# Patient Record
Sex: Female | Born: 1965 | Race: Black or African American | Hispanic: No | Marital: Single | Smoking: Former smoker
Health system: Southern US, Community
[De-identification: ages and names within clinical notes are randomized; demographics above are authoritative.]

## PROBLEM LIST (undated history)

## (undated) DIAGNOSIS — J45909 Unspecified asthma, uncomplicated: Secondary | ICD-10-CM

## (undated) DIAGNOSIS — I509 Heart failure, unspecified: Secondary | ICD-10-CM

## (undated) DIAGNOSIS — I10 Essential (primary) hypertension: Secondary | ICD-10-CM

## (undated) DIAGNOSIS — I2510 Atherosclerotic heart disease of native coronary artery without angina pectoris: Secondary | ICD-10-CM

## (undated) DIAGNOSIS — J40 Bronchitis, not specified as acute or chronic: Secondary | ICD-10-CM

## (undated) HISTORY — PX: HERNIA REPAIR: SHX51

## (undated) HISTORY — PX: TUBAL LIGATION: SHX77

## (undated) HISTORY — PX: ABDOMINAL HYSTERECTOMY: SHX81

---

## 2009-11-26 ENCOUNTER — Emergency Department (HOSPITAL_COMMUNITY): Admission: EM | Admit: 2009-11-26 | Discharge: 2009-11-26 | Payer: Self-pay | Admitting: Emergency Medicine

## 2009-12-12 ENCOUNTER — Emergency Department (HOSPITAL_COMMUNITY): Admission: EM | Admit: 2009-12-12 | Discharge: 2009-12-12 | Payer: Self-pay | Admitting: Emergency Medicine

## 2009-12-27 ENCOUNTER — Encounter
Admission: RE | Admit: 2009-12-27 | Discharge: 2010-01-19 | Payer: Self-pay | Source: Home / Self Care | Attending: Family Medicine | Admitting: Family Medicine

## 2010-04-26 LAB — URINALYSIS, ROUTINE W REFLEX MICROSCOPIC
Bilirubin Urine: NEGATIVE
Ketones, ur: NEGATIVE mg/dL
Leukocytes, UA: NEGATIVE
Nitrite: NEGATIVE
Protein, ur: NEGATIVE mg/dL
Urobilinogen, UA: 0.2 mg/dL (ref 0.0–1.0)

## 2010-04-26 LAB — URINE MICROSCOPIC-ADD ON

## 2010-04-26 LAB — URINE CULTURE
Colony Count: >=100000
Culture  Setup Time: 201110312247

## 2010-04-26 LAB — WET PREP, GENITAL
WBC, Wet Prep HPF POC: NONE SEEN
Yeast Wet Prep HPF POC: NONE SEEN

## 2010-11-19 ENCOUNTER — Emergency Department (HOSPITAL_BASED_OUTPATIENT_CLINIC_OR_DEPARTMENT_OTHER)
Admission: EM | Admit: 2010-11-19 | Discharge: 2010-11-19 | Disposition: A | Discharge status: Home / Self Care | Payer: Medicaid Other | Attending: Emergency Medicine | Admitting: Emergency Medicine

## 2010-11-19 ENCOUNTER — Encounter: Payer: Self-pay | Admitting: Emergency Medicine

## 2010-11-19 DIAGNOSIS — N390 Urinary tract infection, site not specified: Secondary | ICD-10-CM | POA: Insufficient documentation

## 2010-11-19 DIAGNOSIS — N9489 Other specified conditions associated with female genital organs and menstrual cycle: Secondary | ICD-10-CM | POA: Insufficient documentation

## 2010-11-19 DIAGNOSIS — R300 Dysuria: Secondary | ICD-10-CM | POA: Insufficient documentation

## 2010-11-19 DIAGNOSIS — F17200 Nicotine dependence, unspecified, uncomplicated: Secondary | ICD-10-CM | POA: Insufficient documentation

## 2010-11-19 LAB — URINE MICROSCOPIC-ADD ON

## 2010-11-19 LAB — URINALYSIS, ROUTINE W REFLEX MICROSCOPIC
Glucose, UA: NEGATIVE mg/dL
Protein, ur: NEGATIVE mg/dL
Specific Gravity, Urine: 1.019 (ref 1.005–1.030)

## 2010-11-19 MED ORDER — CIPROFLOXACIN HCL 500 MG PO TABS: 500.0000 mg | ORAL_TABLET | Freq: Two times a day (BID) | ORAL | Status: AC

## 2010-11-19 MED ORDER — OXYCODONE-ACETAMINOPHEN 5-325 MG PO TABS: 2.0000 | ORAL_TABLET | ORAL | Status: AC | PRN

## 2010-11-19 MED ORDER — IBUPROFEN 800 MG PO TABS
800.0000 mg | ORAL_TABLET | Freq: Once | ORAL | Status: AC
  Administered 2010-11-19: 800 mg via ORAL
  Filled 2010-11-19: qty 1

## 2010-11-19 MED ORDER — CIPROFLOXACIN HCL 500 MG PO TABS
500.0000 mg | ORAL_TABLET | Freq: Once | ORAL | Status: AC
  Administered 2010-11-19: 500 mg via ORAL
  Filled 2010-11-19: qty 1

## 2010-11-19 NOTE — ED Notes (Signed)
Pt having right sided pelvic pain with dysuria x one week.  Pt states painful urination with foul smelling urine.  No known fever, no vaginal discharge.

## 2010-11-19 NOTE — ED Provider Notes (Signed)
History     CSN: [id number] Arrival date & time: 11/19/2010  9:01 AM  Chief Complaint  Patient presents with  . Pelvic Pain  . Dysuria    (Consider location/radiation/quality/duration/timing/severity/associated sxs/prior treatment) Patient is a 45 y.o. female presenting with pelvic pain and dysuria. The history is provided by the patient.  Pelvic Pain  Dysuria    patient complains of dysuria and right flank pain for one week. No history of rashes no vaginal bleeding or discharge. No fever noted or vomiting or diarrhea. Nothing makes symptoms better or worse. Notes a foul smelling urine. Patient denies any radicular symptoms  History reviewed. No pertinent past medical history.  Past Surgical History  Procedure Date  . Abdominal hysterectomy   . Hernia repair   . Tubal ligation     History reviewed. No pertinent family history.  History  Substance Use Topics  . Smoking status: Current Some Day Smoker    Types: Cigarettes  . Smokeless tobacco: Not on file  . Alcohol Use: Yes     occasionally    OB History    Grav Para Term Preterm Abortions TAB SAB Ect Mult Living                  Review of Systems  Genitourinary: Positive for dysuria and pelvic pain.  All other systems reviewed and are negative.    Allergies  Review of patient's allergies indicates no known allergies.  Home Medications  No current outpatient prescriptions on file.  BP 144/87  Pulse 94  Temp(Src) 98.3 F (36.8 C) (Oral)  Resp 16  Ht 5\' 6"  (1.676 m)  Wt 165 lb (74.844 kg)  BMI 26.63 kg/m2  SpO2 100%  Physical Exam  Nursing note and vitals reviewed. Constitutional: She is oriented to person, place, and time. Vital signs are normal. She appears well-developed and well-nourished.  Non-toxic appearance. No distress.  HENT:  Head: Normocephalic and atraumatic.  Eyes: Conjunctivae and EOM are normal. Pupils are equal, round, and reactive to light.  Neck: Normal range of motion. Neck  supple. No tracheal deviation present.  Cardiovascular: Normal rate, regular rhythm and normal heart sounds.  Exam reveals no gallop.   No murmur heard. Pulmonary/Chest: Effort normal and breath sounds normal. No stridor. No respiratory distress. She has no wheezes.  Abdominal: Soft. Normal appearance and bowel sounds are normal. She exhibits no distension. There is no splenomegaly or hepatomegaly. There is tenderness in the suprapubic area. There is no rigidity, no rebound, no guarding, no CVA tenderness and no tenderness at McBurney's point.    Musculoskeletal: Normal range of motion. She exhibits no edema and no tenderness.  Neurological: She is alert and oriented to person, place, and time. She has normal strength. No cranial nerve deficit or sensory deficit. GCS eye subscore is 4. GCS verbal subscore is 5. GCS motor subscore is 6.  Skin: Skin is warm and dry.  Psychiatric: She has a normal mood and affect. Her speech is normal and behavior is normal.    ED Course  Procedures (including critical care time)   Labs Reviewed  URINALYSIS, ROUTINE W REFLEX MICROSCOPIC   No results found.   No diagnosis found.    MDM  Pt to be treated for uti        Toy Novack, MD 11/19/10 [phone]

## 2010-11-26 ENCOUNTER — Emergency Department (INDEPENDENT_AMBULATORY_CARE_PROVIDER_SITE_OTHER): Payer: Medicaid Other

## 2010-11-26 ENCOUNTER — Emergency Department (HOSPITAL_BASED_OUTPATIENT_CLINIC_OR_DEPARTMENT_OTHER)
Admission: EM | Admit: 2010-11-26 | Discharge: 2010-11-26 | Disposition: A | Discharge status: Home / Self Care | Payer: Medicaid Other | Attending: Emergency Medicine | Admitting: Emergency Medicine

## 2010-11-26 ENCOUNTER — Encounter (HOSPITAL_BASED_OUTPATIENT_CLINIC_OR_DEPARTMENT_OTHER): Payer: Self-pay | Admitting: Emergency Medicine

## 2010-11-26 DIAGNOSIS — R109 Unspecified abdominal pain: Secondary | ICD-10-CM

## 2010-11-26 DIAGNOSIS — M533 Sacrococcygeal disorders, not elsewhere classified: Secondary | ICD-10-CM | POA: Insufficient documentation

## 2010-11-26 DIAGNOSIS — B9689 Other specified bacterial agents as the cause of diseases classified elsewhere: Secondary | ICD-10-CM | POA: Insufficient documentation

## 2010-11-26 DIAGNOSIS — L293 Anogenital pruritus, unspecified: Secondary | ICD-10-CM | POA: Insufficient documentation

## 2010-11-26 DIAGNOSIS — A499 Bacterial infection, unspecified: Secondary | ICD-10-CM | POA: Insufficient documentation

## 2010-11-26 DIAGNOSIS — F17200 Nicotine dependence, unspecified, uncomplicated: Secondary | ICD-10-CM | POA: Insufficient documentation

## 2010-11-26 DIAGNOSIS — N760 Acute vaginitis: Secondary | ICD-10-CM | POA: Insufficient documentation

## 2010-11-26 LAB — WET PREP, GENITAL: Trich, Wet Prep: NONE SEEN

## 2010-11-26 LAB — URINE MICROSCOPIC-ADD ON

## 2010-11-26 LAB — URINALYSIS, ROUTINE W REFLEX MICROSCOPIC
Bilirubin Urine: NEGATIVE
Glucose, UA: NEGATIVE mg/dL
Ketones, ur: NEGATIVE mg/dL
Nitrite: NEGATIVE
Protein, ur: NEGATIVE mg/dL
pH: 7.5 (ref 5.0–8.0)

## 2010-11-26 MED ORDER — METRONIDAZOLE 500 MG PO TABS: 500.0000 mg | ORAL_TABLET | Freq: Two times a day (BID) | ORAL | Status: AC

## 2010-11-26 MED ORDER — HYDROCODONE-ACETAMINOPHEN 5-325 MG PO TABS
1.0000 | ORAL_TABLET | Freq: Once | ORAL | Status: AC
  Administered 2010-11-26: 1 via ORAL
  Filled 2010-11-26: qty 1

## 2010-11-26 MED ORDER — HYDROCODONE-ACETAMINOPHEN 5-500 MG PO TABS: 1.0000 | ORAL_TABLET | Freq: Four times a day (QID) | ORAL | Status: AC | PRN

## 2010-11-26 NOTE — ED Notes (Signed)
Pt verbalizes care plan well 

## 2010-11-26 NOTE — ED Provider Notes (Signed)
History     CSN: [id number] Arrival date & time: 11/26/2010 11:05 AM  Chief Complaint  Patient presents with  . Vaginal Itching  . Urinary Tract Infection    (Consider location/radiation/quality/duration/timing/severity/associated sxs/prior treatment) HPI Comments: Pt states that in the past when she has had this pain it has been a kidney stone:pt states that she was seen here about 1 week ago and treated for a uti and it doesn't seem like the symptoms have resolved:pt c/o right lower abdominal pain/flank pain  Patient is a 45 y.o. female presenting with vaginal itching and urinary tract infection. The history is provided by the patient.  Vaginal Itching This is a new problem. The current episode started in the past 7 days. The problem occurs constantly. The problem has been unchanged. Associated symptoms include abdominal pain and urinary symptoms. Pertinent negatives include no vomiting. The symptoms are aggravated by nothing. She has tried nothing for the symptoms.  Urinary Tract Infection This is a recurrent problem. The current episode started 1 to 4 weeks ago. The problem has been gradually improving. Associated symptoms include abdominal pain and urinary symptoms. Pertinent negatives include no vomiting. The symptoms are aggravated by nothing. Treatments tried: antibiotics. The treatment provided no relief.    No past medical history on file.  Past Surgical History  Procedure Date  . Abdominal hysterectomy   . Hernia repair   . Tubal ligation     No family history on file.  History  Substance Use Topics  . Smoking status: Current Some Day Smoker    Types: Cigarettes  . Smokeless tobacco: Not on file  . Alcohol Use: Yes     occasionally    OB History    Grav Para Term Preterm Abortions TAB SAB Ect Mult Living                  Review of Systems  Gastrointestinal: Positive for abdominal pain. Negative for vomiting.  All other systems reviewed and are  negative.    Allergies  Review of patient's allergies indicates no known allergies.  Home Medications   Current Outpatient Rx  Name Route Sig Dispense Refill  . CIPROFLOXACIN HCL 500 MG PO TABS Oral Take 1 tablet (500 mg total) by mouth 2 (two) times daily. 14 tablet 0  . OXYCODONE-ACETAMINOPHEN 5-325 MG PO TABS Oral Take 2 tablets by mouth every 4 (four) hours as needed for pain. 10 tablet 0    BP 144/85  Pulse 82  Temp(Src) 98.6 F (37 C) (Oral)  Resp 22  SpO2 100%  Physical Exam  Nursing note and vitals reviewed. Constitutional: She is oriented to person, place, and time. She appears well-developed and well-nourished.  HENT:  Head: Normocephalic.  Neck: Normal range of motion.  Cardiovascular: Normal rate and regular rhythm.   Pulmonary/Chest: Effort normal and breath sounds normal.  Abdominal: Soft.    Genitourinary:       Pt has white vaginal discharge  Musculoskeletal: Normal range of motion.  Neurological: She is alert and oriented to person, place, and time.  Skin: Skin is warm and dry.  Psychiatric: She has a normal mood and affect.    ED Course  Procedures (including critical care time)  Labs Reviewed  URINALYSIS, ROUTINE W REFLEX MICROSCOPIC - Abnormal; Notable for the following:    Hgb urine dipstick SMALL (*)    All other components within normal limits  URINE MICROSCOPIC-ADD ON - Abnormal; Notable for the following:    Squamous Epithelial /  LPF MANY (*)    Bacteria, UA MANY (*)    All other components within normal limits  URINE CULTURE  GC/CHLAMYDIA PROBE AMP, GENITAL  WET PREP, GENITAL   Ct Abdomen Pelvis Wo Contrast  11/26/2010  *RADIOLOGY REPORT*  Clinical Data: Right-sided flank pain radiating to the lower back pain history of partial hysterectomy and tubal ligation, history of renal stones  CT ABDOMEN AND PELVIS WITHOUT CONTRAST  Technique:  Multidetector CT imaging of the abdomen and pelvis was performed following the standard protocol  without intravenous contrast.  Comparison: None  Findings:  The lack of intravenous contrast on is seen daily to evaluate solid abdominal organs.  Normal hepatic contour.  The gallbladder is decompressed.  No ascites.  Normal noncontrast appearance of the bilateral kidneys.  No renal stones.  No evidence of hydronephrosis.  The urinary bladder is normal for degree of distension.  Normal noncontrast appearance of the bilateral adrenal glands.  Normal noncontrast appearance of the pancreas and spleen.  Colonic diverticulosis without definite evidence of diverticulitis on this noncontrast examination.  No enteric obstruction.  Normal noncontrast appearance of the appendix.  No pneumoperitoneum, pneumatosis or portal venous gas.  Normal caliber abdominal aorta. No retroperitoneal, mesenteric, pelvic or inguinal lymphadenopathy. Pelvic organs are normal.  No free fluid within the pelvis.  Limited visualization of the lower thorax is negative for focal airspace opacity or pleural effusion.  Normal heart size.  No pericardial effusion.  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  No acute osseous abnormalities.  IMPRESSION:     1.    No explanation for patient's right-sided flank pain.       Specifically, no renal stones or evidence of right-sided       hydronephrosis.        2.    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  Clinical correlation is advised.  Original Report Authenticated By: Waynard Reeds, M.D.     1. Bacterial vaginosis   2. Disorder of SI (sacroiliac) joint       MDM  Discussed finding and referred for follow up:pt denied history of psoriasis        Teressa Lower, NP 11/26/10 1323

## 2010-11-26 NOTE — ED Notes (Addendum)
Pt states she went to dentist on Thursday and was told she had high blood pressure.  Pt wanting it rechecked.  Pt also here on 10/7 for UTI.  Pt relates she still has symptoms, including burning, vaginal discharge and itching.

## 2010-11-27 LAB — URINE CULTURE
Colony Count: NO GROWTH
Culture  Setup Time: 201210150237

## 2010-11-27 NOTE — ED Provider Notes (Signed)
Medical screening examination/treatment/procedure(s) were performed by non-physician practitioner and as supervising physician I was immediately available for consultation/collaboration.   Demarques Pilz A Cali Hope, MD 11/27/10 2350 

## 2010-12-25 ENCOUNTER — Encounter (HOSPITAL_BASED_OUTPATIENT_CLINIC_OR_DEPARTMENT_OTHER): Payer: Self-pay

## 2010-12-25 ENCOUNTER — Emergency Department (HOSPITAL_BASED_OUTPATIENT_CLINIC_OR_DEPARTMENT_OTHER)
Admission: EM | Admit: 2010-12-25 | Discharge: 2010-12-25 | Disposition: A | Discharge status: Home / Self Care | Payer: Medicaid Other | Attending: Emergency Medicine | Admitting: Emergency Medicine

## 2010-12-25 DIAGNOSIS — J069 Acute upper respiratory infection, unspecified: Secondary | ICD-10-CM | POA: Insufficient documentation

## 2010-12-25 DIAGNOSIS — F17200 Nicotine dependence, unspecified, uncomplicated: Secondary | ICD-10-CM | POA: Insufficient documentation

## 2010-12-25 DIAGNOSIS — R059 Cough, unspecified: Secondary | ICD-10-CM | POA: Insufficient documentation

## 2010-12-25 DIAGNOSIS — R05 Cough: Secondary | ICD-10-CM | POA: Insufficient documentation

## 2010-12-25 DIAGNOSIS — J45909 Unspecified asthma, uncomplicated: Secondary | ICD-10-CM | POA: Insufficient documentation

## 2010-12-25 DIAGNOSIS — R51 Headache: Secondary | ICD-10-CM | POA: Insufficient documentation

## 2010-12-25 DIAGNOSIS — I509 Heart failure, unspecified: Secondary | ICD-10-CM | POA: Insufficient documentation

## 2010-12-25 DIAGNOSIS — R1110 Vomiting, unspecified: Secondary | ICD-10-CM | POA: Insufficient documentation

## 2010-12-25 HISTORY — DX: Bronchitis, not specified as acute or chronic: J40

## 2010-12-25 HISTORY — DX: Heart failure, unspecified: I50.9

## 2010-12-25 LAB — URINE MICROSCOPIC-ADD ON

## 2010-12-25 LAB — URINALYSIS, ROUTINE W REFLEX MICROSCOPIC
Bilirubin Urine: NEGATIVE
Glucose, UA: NEGATIVE mg/dL
Ketones, ur: NEGATIVE mg/dL
Leukocytes, UA: NEGATIVE
Nitrite: NEGATIVE
Protein, ur: NEGATIVE mg/dL
Specific Gravity, Urine: 1.018 (ref 1.005–1.030)
Urobilinogen, UA: 0.2 mg/dL (ref 0.0–1.0)
pH: 7.5 (ref 5.0–8.0)

## 2010-12-25 LAB — COMPREHENSIVE METABOLIC PANEL
ALT: 15 U/L (ref 0–35)
AST: 19 U/L (ref 0–37)
Albumin: 3.8 g/dL (ref 3.5–5.2)
Alkaline Phosphatase: 73 U/L (ref 39–117)
BUN: 13 mg/dL (ref 6–23)
CO2: 26 mEq/L (ref 19–32)
Calcium: 9.2 mg/dL (ref 8.4–10.5)
Chloride: 105 mEq/L (ref 96–112)
Creatinine, Ser: 0.6 mg/dL (ref 0.50–1.10)
GFR calc Af Amer: >90 mL/min (ref >90)
GFR calc non Af Amer: >90 mL/min (ref >90)
Glucose, Bld: 79 mg/dL (ref 70–99)
Potassium: 3.6 mEq/L (ref 3.5–5.1)
Sodium: 142 mEq/L (ref 135–145)
Total Bilirubin: 0.7 mg/dL (ref 0.3–1.2)
Total Protein: 7.5 g/dL (ref 6.0–8.3)

## 2010-12-25 LAB — CBC
HCT: 39.5 % (ref 36.0–46.0)
Hemoglobin: 13.4 g/dL (ref 12.0–15.0)
MCH: 30.7 pg (ref 26.0–34.0)
MCHC: 33.9 g/dL (ref 30.0–36.0)
MCV: 90.6 fL (ref 78.0–100.0)
Platelets: 263 10*3/uL (ref 150–400)
RBC: 4.36 MIL/uL (ref 3.87–5.11)
RDW: 13 % (ref 11.5–15.5)
WBC: 4.2 10*3/uL (ref 4.0–10.5)

## 2010-12-25 MED ORDER — LORATADINE 10 MG PO TABS
ORAL_TABLET | ORAL | Status: AC
  Administered 2010-12-25: 10 mg
  Filled 2010-12-25: qty 1

## 2010-12-25 MED ORDER — BENZONATATE 100 MG PO CAPS
ORAL_CAPSULE | ORAL | Status: AC
  Administered 2010-12-25: 100 mg
  Filled 2010-12-25: qty 1

## 2010-12-25 MED ORDER — BENZONATATE 100 MG PO CAPS: 100.0000 mg | ORAL_CAPSULE | Freq: Three times a day (TID) | ORAL | Status: AC

## 2010-12-25 MED ORDER — LORATADINE 10 MG PO TABS: 10.0000 mg | ORAL_TABLET | Freq: Every day | ORAL | Status: DC

## 2010-12-25 MED ORDER — KETOROLAC TROMETHAMINE 15 MG/ML IJ SOLN
15.0000 mg | Freq: Once | INTRAMUSCULAR | Status: AC
  Administered 2010-12-25: 15 mg via INTRAVENOUS
  Filled 2010-12-25: qty 1

## 2010-12-25 MED ORDER — SODIUM CHLORIDE 0.9 % IV BOLUS (SEPSIS)
1000.0000 mL | Freq: Once | INTRAVENOUS | Status: AC
  Administered 2010-12-25: 1000 mL via INTRAVENOUS

## 2010-12-25 NOTE — ED Notes (Signed)
Pt report genralized body aches, cold symptoms, vomiting and constipation that started Friday.

## 2010-12-29 NOTE — ED Provider Notes (Signed)
History    45yF with multiple complaints. Cough. Congestion. Body aches. Symptom onset a few days ago. Cough nonproductive. No sob. Subjective fever. Vomiting x1. NBNB. No diarrhea. No rash. Feels stuffy. Smoker.   CSN: [id number] Arrival date & time: 12/25/2010  8:35 AM   First MD Initiated Contact with Patient 12/25/10 [phone]      Chief Complaint  Patient presents with  . Generalized Body Aches  . Cough  . Emesis  . Headache  . URI    (Consider location/radiation/quality/duration/timing/severity/associated sxs/prior treatment) HPI  Past Medical History  Diagnosis Date  . CHF (congestive heart failure)   . Asthma   . Bronchitis     Past Surgical History  Procedure Date  . Abdominal hysterectomy   . Hernia repair   . Tubal ligation     No family history on file.  History  Substance Use Topics  . Smoking status: Current Some Day Smoker    Types: Cigarettes  . Smokeless tobacco: Not on file  . Alcohol Use: Yes     occasionally    OB History    Grav Para Term Preterm Abortions TAB SAB Ect Mult Living                  Review of Systems  Review of symptoms negative unless otherwise noted in HPI.   Allergies  Review of patient's allergies indicates no known allergies.  Home Medications   Current Outpatient Rx  Name Route Sig Dispense Refill  . DIPHENHYDRAMINE HCL (SLEEP) 25 MG PO TABS Oral Take 25 mg by mouth as needed.      Marland Kitchen BENZONATATE 100 MG PO CAPS Oral Take 1 capsule (100 mg total) by mouth every 8 (eight) hours. 21 capsule 0  . LORATADINE 10 MG PO TABS Oral Take 1 tablet (10 mg total) by mouth daily. 10 tablet 0    BP 142/96  Pulse 92  Temp(Src) 99.1 F (37.3 C) (Oral)  Resp 16  Ht 5\' 6"  (1.676 m)  Wt 150 lb (68.04 kg)  BMI 24.21 kg/m2  SpO2 99%  Physical Exam  Nursing note and vitals reviewed. Constitutional: She appears well-developed and well-nourished. No distress.  HENT:  Head: Normocephalic and atraumatic.  Right Ear: External  ear normal.  Left Ear: External ear normal.  Nose: Nose normal.  Mouth/Throat: Oropharynx is clear and moist.  Eyes: Conjunctivae are normal. Pupils are equal, round, and reactive to light. Right eye exhibits no discharge. Left eye exhibits no discharge.  Neck: Neck supple.  Cardiovascular: Normal rate, regular rhythm and normal heart sounds.  Exam reveals no gallop and no friction rub.   No murmur heard. Pulmonary/Chest: Effort normal and breath sounds normal. No respiratory distress.  Abdominal: Soft. She exhibits no distension. There is no tenderness.  Musculoskeletal: She exhibits no tenderness.  Neurological: She is alert.  Skin: Skin is warm and dry. No rash noted.  Psychiatric: She has a normal mood and affect. Her behavior is normal. Thought content normal.    ED Course  Procedures (including critical care time)  Labs Reviewed  URINALYSIS, ROUTINE W REFLEX MICROSCOPIC - Abnormal; Notable for the following:    Hgb urine dipstick SMALL (*)    All other components within normal limits  URINE MICROSCOPIC-ADD ON - Abnormal; Notable for the following:    Squamous Epithelial / LPF FEW (*)    Bacteria, UA FEW (*)    All other components within normal limits  CBC  COMPREHENSIVE METABOLIC PANEL  No results found.   1. Upper respiratory infection       MDM  45yF with multiple symptoms. Suspect viral UTI. Clinically well appearing with nonfocal exam. Very low suspicion for SBI. Plan symptomatic tx and outpt fu.        Raeford Razor, MD 12/31/10 [phone]

## 2011-03-09 ENCOUNTER — Encounter (HOSPITAL_BASED_OUTPATIENT_CLINIC_OR_DEPARTMENT_OTHER): Payer: Self-pay | Admitting: Family Medicine

## 2011-03-09 ENCOUNTER — Emergency Department (HOSPITAL_BASED_OUTPATIENT_CLINIC_OR_DEPARTMENT_OTHER)
Admission: EM | Admit: 2011-03-09 | Discharge: 2011-03-09 | Disposition: A | Discharge status: Home / Self Care | Payer: Medicaid Other | Attending: Emergency Medicine | Admitting: Emergency Medicine

## 2011-03-09 DIAGNOSIS — K5900 Constipation, unspecified: Secondary | ICD-10-CM | POA: Insufficient documentation

## 2011-03-09 DIAGNOSIS — Z79899 Other long term (current) drug therapy: Secondary | ICD-10-CM | POA: Insufficient documentation

## 2011-03-09 DIAGNOSIS — I10 Essential (primary) hypertension: Secondary | ICD-10-CM | POA: Insufficient documentation

## 2011-03-09 DIAGNOSIS — J45909 Unspecified asthma, uncomplicated: Secondary | ICD-10-CM | POA: Insufficient documentation

## 2011-03-09 DIAGNOSIS — I509 Heart failure, unspecified: Secondary | ICD-10-CM | POA: Insufficient documentation

## 2011-03-09 DIAGNOSIS — F17200 Nicotine dependence, unspecified, uncomplicated: Secondary | ICD-10-CM | POA: Insufficient documentation

## 2011-03-09 DIAGNOSIS — R109 Unspecified abdominal pain: Secondary | ICD-10-CM | POA: Insufficient documentation

## 2011-03-09 HISTORY — DX: Essential (primary) hypertension: I10

## 2011-03-09 MED ORDER — DOCUSATE SODIUM 100 MG PO CAPS: 100.0000 mg | ORAL_CAPSULE | Freq: Two times a day (BID) | ORAL | Status: AC

## 2011-03-09 MED ORDER — POLYETHYLENE GLYCOL 3350 17 G PO PACK: 17.0000 g | PACK | Freq: Two times a day (BID) | ORAL | Status: AC

## 2011-03-09 MED ORDER — DISPOSABLE ENEMA 19-7 GM/118ML RE ENEM: 3.0000 | ENEMA | Freq: Once | RECTAL | Status: AC

## 2011-03-09 NOTE — ED Provider Notes (Signed)
History    46 year old female with the chief complaint constipation. Patient states that she has not had a bowel movement in about 10 days. In the past couple days has had some intermittent crampy abdominal pain. Patient is passing gas. State she has tried drinking some type of tea to relieve her constipation without relief. Has not tried any laxatives. No nausea or vomiting. Status post hysterectomy no other abdominal or pelvic procedures. Denies history of bowel obstruction. No urinary complaints.  CSN: [id number]  Arrival date & time 03/09/11  4098   First MD Initiated Contact with Patient 03/09/11 1002      Chief Complaint  Patient presents with  . Constipation    (Consider location/radiation/quality/duration/timing/severity/associated sxs/prior treatment) HPI  Past Medical History  Diagnosis Date  . CHF (congestive heart failure)   . Asthma   . Bronchitis   . Hypertension     Past Surgical History  Procedure Date  . Abdominal hysterectomy   . Hernia repair   . Tubal ligation     No family history on file.  History  Substance Use Topics  . Smoking status: Current Some Day Smoker    Types: Cigarettes  . Smokeless tobacco: Not on file  . Alcohol Use: Yes     occasionally    OB History    Grav Para Term Preterm Abortions TAB SAB Ect Mult Living                  Review of Systems   Review of symptoms negative unless otherwise noted in HPI.   Allergies  Review of patient's allergies indicates no known allergies.  Home Medications   Current Outpatient Rx  Name Route Sig Dispense Refill  . DIPHENHYDRAMINE HCL (SLEEP) 25 MG PO TABS Oral Take 25 mg by mouth as needed.      Marland Kitchen DOCUSATE SODIUM 100 MG PO CAPS Oral Take 1 capsule (100 mg total) by mouth every 12 (twelve) hours. 60 capsule 0  . LORATADINE 10 MG PO TABS Oral Take 1 tablet (10 mg total) by mouth daily. 10 tablet 0  . POLYETHYLENE GLYCOL 3350 PO PACK Oral Take 17 g by mouth 2 (two) times daily. 14  each 0  . DISPOSABLE ENEMA 19-7 GM/118ML RE ENEM Rectal Place 3 enemas rectally once. follow package directions 135 mL 0    BP 142/96  Pulse 101  Temp(Src) 97.7 F (36.5 C) (Oral)  Resp 16  Ht 5\' 6"  (1.676 m)  Wt 160 lb (72.576 kg)  BMI 25.82 kg/m2  SpO2 100%  Physical Exam  Nursing note and vitals reviewed. Constitutional: She appears well-developed and well-nourished. No distress.       Laying in bed. No acute distress.  HENT:  Head: Normocephalic and atraumatic.  Eyes: Conjunctivae are normal. Right eye exhibits no discharge. Left eye exhibits no discharge.  Neck: Neck supple.  Cardiovascular: Normal rate, regular rhythm and normal heart sounds.  Exam reveals no gallop and no friction rub.   No murmur heard. Pulmonary/Chest: Effort normal and breath sounds normal. No respiratory distress.       Abdomen soft and nontender. There is no distention. No mass palpated.  Abdominal: Soft. She exhibits no distension. There is no tenderness.  Musculoskeletal: She exhibits no edema and no tenderness.  Neurological: She is alert.  Skin: Skin is warm and dry.  Psychiatric: She has a normal mood and affect. Her behavior is normal. Thought content normal.    ED Course  Procedures (including critical  care time)  Labs Reviewed - No data to display No results found.   1. Constipation   2. Abdominal pain       MDM  46 year old female with constipation and crampy abdominal pain. Abdominal exam is benign. Very low clinical suspicion for urgent/emergent abdominal surgical process. Do not suspect mechanical bowel obstruction. Plan enemas and polyethylene glycol. Discussed increasing dietary intake of fiber. Also will add stool softener once patient gets going again. Outpatient followup.        Raeford Razor, MD 03/09/11 [phone]

## 2011-03-09 NOTE — ED Notes (Addendum)
Pt sts she hasn't had a bowel movement in 10 days. Pt sts her normal is to have a bowel movement "once a week". Pt sts she is passing  "a lot of gas". Pt sts she as not taken laxatives or stool softeners for issue.  Pt also c/o rash to face and neck with itching.

## 2011-05-21 ENCOUNTER — Emergency Department (INDEPENDENT_AMBULATORY_CARE_PROVIDER_SITE_OTHER): Payer: Self-pay

## 2011-05-21 ENCOUNTER — Encounter (HOSPITAL_BASED_OUTPATIENT_CLINIC_OR_DEPARTMENT_OTHER): Payer: Self-pay

## 2011-05-21 ENCOUNTER — Emergency Department (HOSPITAL_BASED_OUTPATIENT_CLINIC_OR_DEPARTMENT_OTHER)
Admission: EM | Admit: 2011-05-21 | Discharge: 2011-05-21 | Disposition: A | Discharge status: Home / Self Care | Payer: Self-pay | Attending: Emergency Medicine | Admitting: Emergency Medicine

## 2011-05-21 DIAGNOSIS — R05 Cough: Secondary | ICD-10-CM

## 2011-05-21 DIAGNOSIS — J45909 Unspecified asthma, uncomplicated: Secondary | ICD-10-CM | POA: Insufficient documentation

## 2011-05-21 DIAGNOSIS — R197 Diarrhea, unspecified: Secondary | ICD-10-CM | POA: Insufficient documentation

## 2011-05-21 DIAGNOSIS — I509 Heart failure, unspecified: Secondary | ICD-10-CM | POA: Insufficient documentation

## 2011-05-21 DIAGNOSIS — R0989 Other specified symptoms and signs involving the circulatory and respiratory systems: Secondary | ICD-10-CM

## 2011-05-21 DIAGNOSIS — R599 Enlarged lymph nodes, unspecified: Secondary | ICD-10-CM | POA: Insufficient documentation

## 2011-05-21 DIAGNOSIS — I10 Essential (primary) hypertension: Secondary | ICD-10-CM | POA: Insufficient documentation

## 2011-05-21 DIAGNOSIS — R1110 Vomiting, unspecified: Secondary | ICD-10-CM | POA: Insufficient documentation

## 2011-05-21 DIAGNOSIS — R059 Cough, unspecified: Secondary | ICD-10-CM | POA: Insufficient documentation

## 2011-05-21 DIAGNOSIS — R0602 Shortness of breath: Secondary | ICD-10-CM | POA: Insufficient documentation

## 2011-05-21 DIAGNOSIS — J069 Acute upper respiratory infection, unspecified: Secondary | ICD-10-CM | POA: Insufficient documentation

## 2011-05-21 DIAGNOSIS — R350 Frequency of micturition: Secondary | ICD-10-CM | POA: Insufficient documentation

## 2011-05-21 DIAGNOSIS — R070 Pain in throat: Secondary | ICD-10-CM | POA: Insufficient documentation

## 2011-05-21 DIAGNOSIS — J3489 Other specified disorders of nose and nasal sinuses: Secondary | ICD-10-CM | POA: Insufficient documentation

## 2011-05-21 MED ORDER — ALBUTEROL SULFATE HFA 108 (90 BASE) MCG/ACT IN AERS
2.0000 | INHALATION_SPRAY | Freq: Once | RESPIRATORY_TRACT | Status: AC
  Administered 2011-05-21: 2 via RESPIRATORY_TRACT
  Filled 2011-05-21: qty 6.7

## 2011-05-21 MED ORDER — IPRATROPIUM BROMIDE 0.02 % IN SOLN
0.5000 mg | Freq: Once | RESPIRATORY_TRACT | Status: AC
  Administered 2011-05-21: 0.5 mg via RESPIRATORY_TRACT
  Filled 2011-05-21: qty 2.5

## 2011-05-21 MED ORDER — PROMETHAZINE HCL 25 MG PO TABS: 25.0000 mg | ORAL_TABLET | Freq: Four times a day (QID) | ORAL | Status: DC | PRN

## 2011-05-21 MED ORDER — ALBUTEROL SULFATE (5 MG/ML) 0.5% IN NEBU
5.0000 mg | INHALATION_SOLUTION | Freq: Once | RESPIRATORY_TRACT | Status: AC
  Administered 2011-05-21: 5 mg via RESPIRATORY_TRACT
  Filled 2011-05-21: qty 1

## 2011-05-21 MED ORDER — HYDROCODONE-ACETAMINOPHEN 5-325 MG PO TABS: 1.0000 | ORAL_TABLET | ORAL | Status: AC | PRN

## 2011-05-21 MED ORDER — ALBUTEROL SULFATE (5 MG/ML) 0.5% IN NEBU
INHALATION_SOLUTION | RESPIRATORY_TRACT | Status: AC
  Filled 2011-05-21: qty 1

## 2011-05-21 NOTE — ED Notes (Signed)
Patient states she developed crampy abdominal pain 6 days ago which has been associated with diarrhea since.  Vomited x 1 6 days ago.  States now she had asthma and bronchitis and has had to use her nebulizer and inhaler for the last 3 days and is now out of medication.

## 2011-05-21 NOTE — ED Notes (Signed)
Patient transported to X-ray 

## 2011-05-21 NOTE — Discharge Instructions (Signed)
Take ibuprofen w/ food up to three times a day, as needed for pain, fever and chills.  Take sudafed as needed for nasal/sinus congestion.  Use albuterol inhaler, 2 puffs every 4 hours, as needed for cough and shortness of breath.  Vicodin will also likely help with your cough and muscle/joint aches and sore throat.  Take promethazine as needed for nausea.   Get rest, drink plenty of fluids and wash hands often to prevent spread of infection.  You should return to the ER if your cough worsens, you develop shortness of breath or symptoms have not started to improve in 7Antibiotic Nonuse  Your caregiver felt that the infection or problem was not one that would be helped with an antibiotic. Infections may be caused by viruses or bacteria. Only a caregiver can tell which one of these is the likely cause of an illness. A cold is the most common cause of infection in both adults and children. A cold is a virus. Antibiotic treatment will have no effect on a viral infection. Viruses can lead to many lost days of work caring for sick children and many missed days of school. Children may catch as many as 10 "colds" or "flus" per year during which they can be tearful, cranky, and uncomfortable. The goal of treating a virus is aimed at keeping the ill person comfortable. Antibiotics are medications used to help the body fight bacterial infections. There are relatively few types of bacteria that cause infections but there are hundreds of viruses. While both viruses and bacteria cause infection they are very different types of germs. A viral infection will typically go away by itself within 7 to 10 days. Bacterial infections may spread or get worse without antibiotic treatment. Examples of bacterial infections are:  Sore throats (like strep throat or tonsillitis).   Infection in the lung (pneumonia).   Ear and skin infections.  Examples of viral infections are:  Colds or flus.   Most coughs and bronchitis.   Sore  throats not caused by Strep.   Runny noses.  It is often best not to take an antibiotic when a viral infection is the cause of the problem. Antibiotics can kill off the helpful bacteria that we have inside our body and allow harmful bacteria to start growing. Antibiotics can cause side effects such as allergies, nausea, and diarrhea without helping to improve the symptoms of the viral infection. Additionally, repeated uses of antibiotics can cause bacteria inside of our body to become resistant. That resistance can be passed onto harmful bacterial. The next time you have an infection it may be harder to treat if antibiotics are used when they are not needed. Not treating with antibiotics allows our own immune system to develop and take care of infections more efficiently. Also, antibiotics will work better for [place] when they are prescribed for bacterial infections. Treatments for a child that is ill may include:  Give extra fluids throughout the day to stay hydrated.   Get plenty of rest.   Only give your child over-the-counter or prescription medicines for pain, discomfort, or fever as directed by your caregiver.   The use of a cool mist humidifier may help stuffy noses.   Cold medications if suggested by your caregiver.  Your caregiver may decide to start you on an antibiotic if:  The problem you were seen for today continues for a longer length of time than expected.   You develop a secondary bacterial infection.  SEEK MEDICAL CARE IF:  Fever lasts longer than 5 days.   Symptoms continue to get worse after 5 to 7 days or become severe.   Difficulty in breathing develops.   Signs of dehydration develop (poor drinking, rare urinating, dark colored urine).   Changes in behavior or worsening tiredness (listlessness or lethargy).  Document Released: 04/09/2001 Document Revised: 01/18/2011 Document Reviewed: 10/06/2008 [facility] Patient Information 2012 [place], [place]. days.

## 2011-05-21 NOTE — ED Notes (Signed)
Prod cough, sore throat, n/v x 2 weeks

## 2011-05-21 NOTE — ED Notes (Signed)
Chart reviewed and care assumed. 

## 2011-05-21 NOTE — ED Provider Notes (Signed)
History     CSN: [id number]  Arrival date & time 05/21/11  1354   First MD Initiated Contact with Patient 05/21/11 1428      Chief Complaint  Patient presents with  . Cough    (Consider location/radiation/quality/duration/timing/severity/associated sxs/prior treatment) HPI History provided by pt.   Pt has had a cough productive of green sputum, occasionally blood-streaked for the past 2 weeks.  Associated w/ nasal congestion, rhinorrhea, sore throat and wheezing.  Develops SOB during a coughing spell and has mild, diffuse CP that is aggravated by coughing.  No known fever.  Pt has a h/o asthma.  Had relief of symptoms w/ nebulizer treatments at home but has run out of medications.   A friend of hers has had similar sx recently.  Last week she developed vomiting and diarrhea as well.  Denies abdominal pain.  Has had urinary frequency but no other GU sx.    Past Medical History  Diagnosis Date  . CHF (congestive heart failure)   . Asthma   . Bronchitis   . Hypertension     Past Surgical History  Procedure Date  . Abdominal hysterectomy   . Hernia repair   . Tubal ligation     No family history on file.  History  Substance Use Topics  . Smoking status: Current Some Day Smoker    Types: Cigarettes  . Smokeless tobacco: Not on file  . Alcohol Use: Yes     occasionally    OB History    Grav Para Term Preterm Abortions TAB SAB Ect Mult Living                  Review of Systems  All other systems reviewed and are negative.    Allergies  Review of patient's allergies indicates no known allergies.  Home Medications   Current Outpatient Rx  Name Route Sig Dispense Refill  . ALBUTEROL SULFATE IN Inhalation Inhale into the lungs.    Marland Kitchen DIPHENHYDRAMINE HCL (SLEEP) 25 MG PO TABS Oral Take 25 mg by mouth as needed.      Marland Kitchen LORATADINE 10 MG PO TABS Oral Take 1 tablet (10 mg total) by mouth daily. 10 tablet 0    BP 136/87  Pulse 101  Temp(Src) 98.4 F (36.9 C) (Oral)   Resp 20  Ht 5\' 6"  (1.676 m)  Wt 171 lb (77.565 kg)  BMI 27.60 kg/m2  SpO2 99%  Physical Exam  Nursing note and vitals reviewed. Constitutional: She is oriented to person, place, and time. She appears well-developed and well-nourished. No distress.  HENT:  Head: Normocephalic and atraumatic.  Mouth/Throat: Oropharynx is clear and moist. No oropharyngeal exudate.  Eyes:       Normal appearance  Neck: Normal range of motion.  Cardiovascular: Normal rate and regular rhythm.        HR 90  Pulmonary/Chest: Effort normal and breath sounds normal. She has no wheezes.       Mild, diffuse chest tenderness  Abdominal: Soft. Bowel sounds are normal. She exhibits no distension and no mass. There is no tenderness. There is no rebound and no guarding.       No CVA tenderness  Musculoskeletal:       No peripheral edema or calf ttp  Lymphadenopathy:    She has cervical adenopathy.  Neurological: She is alert and oriented to person, place, and time.  Skin: Skin is warm and dry. No rash noted.  Psychiatric: She has a normal mood and  affect. Her behavior is normal.    ED Course  Procedures (including critical care time)  Labs Reviewed - No data to display Dg Chest 2 View  05/21/2011  *RADIOLOGY REPORT*  Clinical Data: Cough, congestion  CHEST - 2 VIEW  Comparison:  11/26/2009  Findings:  The heart size and mediastinal contours are within normal limits.  Both lungs are clear.  The visualized skeletal structures are unremarkable.  IMPRESSION: No active cardiopulmonary disease.  Original Report Authenticated By: Judie Petit. Ruel Favors, M.D.     1. Viral URI with cough       MDM  45yo F w/ h/o asthma, CHF and HTN presents w/ URI sx + hemoptysis and N/V/D.  No RF for or exam findings consistent w/ PE.  Afebrile, VS w/in nml range, lungs clear, abd benign/non-tender, no peripheral edema on exam.  Pt received a breathing treatment and reports that her sx are improved.  CXR neg for pneumonia/CHF.  Results  discussed w/ pt.  Pt received an albuterol inhaler and d/c'd home w/ vicodin (to take w/ OTC anti-tussive; can not afford tussionex) and promethazine.  Recommended ibuprofen, sudafed and oral hydration as well.  Referred to [facility].  Return precautions discussed.         Otilio Miu, [place] 05/21/11 2222

## 2011-05-21 NOTE — ED Notes (Signed)
Pt returned from radiology.

## 2011-06-09 NOTE — ED Provider Notes (Signed)
Evaluation and management procedures were performed by the PA/NP/resident physician under my supervision/collaboration.   Natalie Mceuen D Hortense Cantrall, MD 06/09/11 1925 

## 2011-08-08 ENCOUNTER — Encounter (HOSPITAL_COMMUNITY): Payer: Self-pay | Admitting: *Deleted

## 2011-08-08 ENCOUNTER — Other Ambulatory Visit: Payer: Self-pay

## 2011-08-08 ENCOUNTER — Emergency Department (HOSPITAL_COMMUNITY)
Admission: EM | Admit: 2011-08-08 | Discharge: 2011-08-09 | Disposition: A | Discharge status: Home / Self Care | Payer: Self-pay | Attending: Emergency Medicine | Admitting: Emergency Medicine

## 2011-08-08 ENCOUNTER — Emergency Department (HOSPITAL_COMMUNITY): Payer: Self-pay

## 2011-08-08 DIAGNOSIS — R112 Nausea with vomiting, unspecified: Secondary | ICD-10-CM | POA: Insufficient documentation

## 2011-08-08 DIAGNOSIS — R51 Headache: Secondary | ICD-10-CM | POA: Insufficient documentation

## 2011-08-08 DIAGNOSIS — R209 Unspecified disturbances of skin sensation: Secondary | ICD-10-CM | POA: Insufficient documentation

## 2011-08-08 LAB — CBC
HCT: 40 % (ref 36.0–46.0)
MCHC: 34.8 g/dL (ref 30.0–36.0)
MCV: 90.7 fL (ref 78.0–100.0)
Platelets: 272 10*3/uL (ref 150–400)
RDW: 13.3 % (ref 11.5–15.5)
WBC: 5 10*3/uL (ref 4.0–10.5)

## 2011-08-08 LAB — BASIC METABOLIC PANEL
BUN: 13 mg/dL (ref 6–23)
Calcium: 9.3 mg/dL (ref 8.4–10.5)
Chloride: 101 mEq/L (ref 96–112)
Creatinine, Ser: 0.63 mg/dL (ref 0.50–1.10)
GFR calc Af Amer: >90 mL/min (ref >90)
GFR calc non Af Amer: >90 mL/min (ref >90)

## 2011-08-08 LAB — URINALYSIS, ROUTINE W REFLEX MICROSCOPIC
Glucose, UA: NEGATIVE mg/dL
Ketones, ur: NEGATIVE mg/dL
Protein, ur: NEGATIVE mg/dL
Urobilinogen, UA: 1 mg/dL (ref 0.0–1.0)

## 2011-08-08 LAB — URINE MICROSCOPIC-ADD ON

## 2011-08-08 MED ORDER — DIPHENHYDRAMINE HCL 50 MG/ML IJ SOLN
25.0000 mg | Freq: Once | INTRAMUSCULAR | Status: AC
  Administered 2011-08-09: 25 mg via INTRAVENOUS
  Filled 2011-08-08: qty 1

## 2011-08-08 MED ORDER — METOCLOPRAMIDE HCL 5 MG/ML IJ SOLN
10.0000 mg | Freq: Once | INTRAMUSCULAR | Status: AC
  Administered 2011-08-09: 10 mg via INTRAVENOUS
  Filled 2011-08-08: qty 2

## 2011-08-08 MED ORDER — ONDANSETRON 4 MG PO TBDP
8.0000 mg | ORAL_TABLET | Freq: Once | ORAL | Status: AC
  Administered 2011-08-08: 8 mg via ORAL

## 2011-08-08 MED ORDER — ONDANSETRON 4 MG PO TBDP
ORAL_TABLET | ORAL | Status: AC
  Filled 2011-08-08: qty 2

## 2011-08-08 NOTE — ED Notes (Addendum)
Pt up to nurse first nurse desk stating she is "wheezing", pt appears in no apparent distress. Triage nurse notified.

## 2011-08-08 NOTE — ED Notes (Addendum)
Pt now states she is having chest pain and her left arm feels numb. Vital signs obtained, triage nurse notified. Pt appears in no apparent distress at this time. Pt taken back for EKG.

## 2011-08-08 NOTE — ED Notes (Addendum)
Pt. Reports waking up this morning with a headache that got progressively worse to about 1220p today up tp "20/10". "The pain got so bad and then it went into my face on the right side, then down into my tooth. It felt like I had just had a tooth pulled".  Pt. Report having this pain once before in 2005 when she was diagnosed with CHF.  Reports a "flutter in her chest, about every that started at approx 430pm today that comes and goes. No flutter right now". "N/V x 12 today. Last emesis approx 10pm".  Reports a sharp pain directly over her navel. Tender on palpation. A. O. X 4. NAD.

## 2011-08-08 NOTE — ED Notes (Signed)
Multiple complaints, reports intermittent pain to right side of face, headache, arm pain, vomiting, diarrhea, abd pain. Chills/hot flashes. No acute distress noted at triage, airway intact.

## 2011-08-09 NOTE — Discharge Instructions (Signed)
Your headache today may have been a complicated migraine. Please followup with neurology for further workup. Return to emergency department for worsening condition or new concerning symptoms.  Headache, General, Unknown Cause The specific cause of your headache may not have been found today. There are many causes and types of headache. A few common ones are:  Tension headache.   Migraine.   Infections (examples: dental and sinus infections).   Bone and/or joint problems in the neck or jaw.   Depression.   Eye problems.  These headaches are not life threatening.  Headaches can sometimes be diagnosed by a patient history and a physical exam. Sometimes, lab and imaging studies (such as x-ray and/or CT scan) are used to rule out more serious problems. In some cases, a spinal tap (lumbar puncture) may be requested. There are many times when your exam and tests may be normal on the first visit even when there is a serious problem causing your headaches. Because of that, it is very important to follow up with your doctor or local clinic for further evaluation. FINDING OUT THE RESULTS OF TESTS  If a radiology test was performed, a radiologist will review your results.   You will be contacted by the emergency department or your physician if any test results require a change in your treatment plan.   Not all test results may be available during your visit. If your test results are not back during the visit, make an appointment with your caregiver to find out the results. Do not assume everything is normal if you have not heard from your caregiver or the medical facility. It is important for you to follow up on all of your test results.  HOME CARE INSTRUCTIONS   Keep follow-up appointments with your caregiver, or any specialist referral.   Only take over-the-counter or prescription medicines for pain, discomfort, or fever as directed by your caregiver.   Biofeedback, massage, or other relaxation  techniques may be helpful.   Ice packs or heat applied to the head and neck can be used. Do this three to four times per day, or as needed.   Call your doctor if you have any questions or concerns.   If you smoke, you should quit.  SEEK MEDICAL CARE IF:   You develop problems with medications prescribed.   You do not respond to or obtain relief from medications.   You have a change from the usual headache.   You develop nausea or vomiting.  SEEK IMMEDIATE MEDICAL CARE IF:   If your headache becomes severe.   You have an unexplained oral temperature above 102 F (38.9 C), or as your caregiver suggests.   You have a stiff neck.   You have loss of vision.   You have muscular weakness.   You have loss of muscular control.   You develop severe symptoms different from your first symptoms.   You start losing your balance or have trouble walking.   You feel faint or pass out.  MAKE SURE YOU:   Understand these instructions.   Will watch your condition.   Will get help right away if you are not doing well or get worse.  Document Released: 01/29/2005 Document Revised: 01/18/2011 Document Reviewed: 09/18/2007 [facility] Patient Information 2012 [place], [place].

## 2011-08-09 NOTE — ED Provider Notes (Signed)
History     CSN: [id number]  Arrival date & time 08/08/11  1478   First MD Initiated Contact with Patient 08/08/11 2257      Chief Complaint  Patient presents with  . Emesis    (Consider location/radiation/quality/duration/timing/severity/associated sxs/prior treatment) HPI 46 year old female presents to emergency room complaining of headache, nausea and vomiting, and numbness. Patient reports upon waking this morning she had right-sided headache. Headache resolved somewhat, but then returned around noon. patient reported with the return of headache, she had numbness to her right face and into her jaw, like she been injected with Novocain. Patient was at [employer], and bought and took [facility] powder. After about 20-30 minutes, the numbness resolved completely. Patient reports around 4:30 she developed nausea and vomiting and diffuse sweating. Patient felt she was having a hot flash. Patient status post partial hysterectomy, and thinks that she is currently undergoing menopause. No fever no chills no head trauma. Patient with a sense of fluttering in her chest as if someone is patting her back to make her burp. She denies palpitations or skipped beats. She denies chest pain. No sick contacts. No prior history of similar headaches. Past Medical History  Diagnosis Date  . CHF (congestive heart failure)   . Asthma   . Bronchitis   . Hypertension     Past Surgical History  Procedure Date  . Abdominal hysterectomy   . Hernia repair   . Tubal ligation     History reviewed. No pertinent family history.  History  Substance Use Topics  . Smoking status: Current Some Day Smoker    Types: Cigarettes  . Smokeless tobacco: Not on file  . Alcohol Use: Yes     occasionally    OB History    Grav Para Term Preterm Abortions TAB SAB Ect Mult Living                  Review of Systems  All other systems reviewed and are negative.    Allergies  Review of patient's allergies indicates no  known allergies.  Home Medications   Current Outpatient Rx  Name Route Sig Dispense Refill  . ALBUTEROL SULFATE (5 MG/ML) 0.5% IN NEBU Nebulization Take 2.5 mg by nebulization every 6 (six) hours as needed. For shortness of breath      BP 127/75  Pulse 69  Temp 98.2 F (36.8 C) (Oral)  Resp 22  SpO2 95%  Physical Exam  Nursing note and vitals reviewed. Constitutional: She is oriented to person, place, and time. She appears well-developed and well-nourished.  HENT:  Head: Normocephalic and atraumatic.  Right Ear: External ear normal.  Left Ear: External ear normal.  Nose: Nose normal.  Mouth/Throat: Oropharynx is clear and moist.  Eyes: Conjunctivae and EOM are normal. Pupils are equal, round, and reactive to light.  Neck: Normal range of motion. Neck supple. No JVD present. No tracheal deviation present. No thyromegaly present.  Cardiovascular: Normal rate, regular rhythm, normal heart sounds and intact distal pulses.  Exam reveals no gallop and no friction rub.   No murmur heard. Pulmonary/Chest: Effort normal and breath sounds normal. No stridor. No respiratory distress. She has no wheezes. She has no rales. She exhibits no tenderness.  Abdominal: Soft. Bowel sounds are normal. She exhibits no distension and no mass. There is no tenderness. There is no rebound and no guarding.  Musculoskeletal: Normal range of motion. She exhibits no edema and no tenderness.  Lymphadenopathy:    She has no cervical  adenopathy.  Neurological: She is oriented to person, place, and time. She has normal reflexes. No cranial nerve deficit. She exhibits normal muscle tone. Coordination normal.  Skin: Skin is dry. No rash noted. No erythema. No pallor.  Psychiatric: She has a normal mood and affect. Her behavior is normal. Judgment and thought content normal.    ED Course  Procedures (including critical care time)   Date: 08/08/2011  Rate: 93  Rhythm: normal sinus rhythm  QRS Axis: normal   Intervals: QT prolonged  ST/T Wave abnormalities: normal  Conduction Disutrbances:incomplete RBBB  Narrative Interpretation:   Old EKG Reviewed: none available   Labs Reviewed  URINALYSIS, ROUTINE W REFLEX MICROSCOPIC - Abnormal; Notable for the following:    Hgb urine dipstick LARGE (*)     All other components within normal limits  URINE MICROSCOPIC-ADD ON - Abnormal; Notable for the following:    Squamous Epithelial / LPF FEW (*)     All other components within normal limits  CBC  BASIC METABOLIC PANEL  POCT I-STAT TROPONIN I  LAB REPORT - SCANNED   Ct Head Wo Contrast  08/08/2011  *RADIOLOGY REPORT*  Clinical Data: 46 year old female with headache, right facial numbness.  Pain.  CT HEAD WITHOUT CONTRAST  Technique:  Contiguous axial images were obtained from the base of the skull through the vertex without contrast.  Comparison: None.  Findings: Visualized paranasal sinuses and mastoids are clear.  No acute osseous abnormality identified.  Visualized orbits and scalp soft tissues are within normal limits.  Mild Calcified atherosclerosis at the skull base.  Incidental cavum septum pellucidum.  No ventriculomegaly. No midline shift, mass effect, or evidence of mass lesion.  No acute intracranial hemorrhage identified.  No evidence of cortically based acute infarction identified.  Gray-white matter differentiation is within normal limits throughout the brain.  No suspicious intracranial vascular hyperdensity.  IMPRESSION: Normal noncontrast CT appearance of the brain.  Original Report Authenticated By: Harley Hallmark, M.D.     1. Headache   2. Nausea and vomiting       MDM  37 -year-old female with headache nausea vomiting and numbness. No prior history, but seems consistent with complex migraine. Patient  given headache cocktail, now feeling well. Workup unremarkable. Neuro exam normal.        Olivia Mackie, MD 08/09/11 [phone]

## 2011-12-07 ENCOUNTER — Emergency Department (HOSPITAL_COMMUNITY): Payer: Self-pay

## 2011-12-07 ENCOUNTER — Encounter (HOSPITAL_COMMUNITY): Payer: Self-pay | Admitting: Emergency Medicine

## 2011-12-07 ENCOUNTER — Observation Stay (HOSPITAL_COMMUNITY)
Admission: EM | Admit: 2011-12-07 | Discharge: 2011-12-08 | Disposition: A | Discharge status: Home / Self Care | Payer: Self-pay | Attending: Internal Medicine | Admitting: Internal Medicine

## 2011-12-07 DIAGNOSIS — Z77098 Contact with and (suspected) exposure to other hazardous, chiefly nonmedicinal, chemicals: Secondary | ICD-10-CM | POA: Insufficient documentation

## 2011-12-07 DIAGNOSIS — T59811A Toxic effect of smoke, accidental (unintentional), initial encounter: Secondary | ICD-10-CM | POA: Diagnosis present

## 2011-12-07 DIAGNOSIS — Z77128 Contact with and (suspected) exposure to other hazards in the physical environment: Secondary | ICD-10-CM

## 2011-12-07 DIAGNOSIS — K219 Gastro-esophageal reflux disease without esophagitis: Secondary | ICD-10-CM

## 2011-12-07 DIAGNOSIS — J45902 Unspecified asthma with status asthmaticus: Secondary | ICD-10-CM

## 2011-12-07 DIAGNOSIS — R079 Chest pain, unspecified: Principal | ICD-10-CM | POA: Insufficient documentation

## 2011-12-07 DIAGNOSIS — R9431 Abnormal electrocardiogram [ECG] [EKG]: Secondary | ICD-10-CM

## 2011-12-07 DIAGNOSIS — I509 Heart failure, unspecified: Secondary | ICD-10-CM | POA: Insufficient documentation

## 2011-12-07 DIAGNOSIS — J45909 Unspecified asthma, uncomplicated: Secondary | ICD-10-CM | POA: Insufficient documentation

## 2011-12-07 DIAGNOSIS — I209 Angina pectoris, unspecified: Secondary | ICD-10-CM

## 2011-12-07 DIAGNOSIS — I10 Essential (primary) hypertension: Secondary | ICD-10-CM | POA: Insufficient documentation

## 2011-12-07 DIAGNOSIS — R0602 Shortness of breath: Secondary | ICD-10-CM | POA: Insufficient documentation

## 2011-12-07 DIAGNOSIS — R000 Tachycardia, unspecified: Secondary | ICD-10-CM

## 2011-12-07 LAB — CBC WITH DIFFERENTIAL/PLATELET
Basophils Absolute: 0 10*3/uL (ref 0.0–0.1)
Eosinophils Absolute: 0.1 10*3/uL (ref 0.0–0.7)
Eosinophils Relative: 1 % (ref 0–5)
Lymphocytes Relative: 45 % (ref 12–46)
MCH: 30.7 pg (ref 26.0–34.0)
MCV: 91.3 fL (ref 78.0–100.0)
Neutrophils Relative %: 47 % (ref 43–77)
Platelets: 310 10*3/uL (ref 150–400)
RDW: 13.3 % (ref 11.5–15.5)
WBC: 4.8 10*3/uL (ref 4.0–10.5)

## 2011-12-07 LAB — TROPONIN I: Troponin I: <0.3 ng/mL (ref <0.30)

## 2011-12-07 LAB — URINALYSIS, ROUTINE W REFLEX MICROSCOPIC
Ketones, ur: NEGATIVE mg/dL
Leukocytes, UA: NEGATIVE
Nitrite: NEGATIVE
Protein, ur: NEGATIVE mg/dL
Urobilinogen, UA: 0.2 mg/dL (ref 0.0–1.0)

## 2011-12-07 LAB — BASIC METABOLIC PANEL
Calcium: 9.2 mg/dL (ref 8.4–10.5)
GFR calc non Af Amer: >90 mL/min (ref >90)
Potassium: 3.1 mEq/L — ABNORMAL LOW (ref 3.5–5.1)
Sodium: 142 mEq/L (ref 135–145)

## 2011-12-07 MED ORDER — METHYLPREDNISOLONE SODIUM SUCC 125 MG IJ SOLR
125.0000 mg | Freq: Once | INTRAMUSCULAR | Status: AC
  Administered 2011-12-08: 125 mg via INTRAVENOUS
  Filled 2011-12-07: qty 2

## 2011-12-07 MED ORDER — ALBUTEROL SULFATE (5 MG/ML) 0.5% IN NEBU
5.0000 mg | INHALATION_SOLUTION | RESPIRATORY_TRACT | Status: AC
  Administered 2011-12-07 (×3): 5 mg via RESPIRATORY_TRACT
  Filled 2011-12-07: qty 1

## 2011-12-07 MED ORDER — POTASSIUM CHLORIDE CRYS ER 20 MEQ PO TBCR
60.0000 meq | EXTENDED_RELEASE_TABLET | Freq: Once | ORAL | Status: AC
  Administered 2011-12-07: 60 meq via ORAL
  Filled 2011-12-07: qty 3

## 2011-12-07 MED ORDER — IPRATROPIUM BROMIDE 0.02 % IN SOLN
0.5000 mg | Freq: Once | RESPIRATORY_TRACT | Status: AC
Start: 2011-12-07 — End: 2011-12-07
  Administered 2011-12-07: 0.5 mg via RESPIRATORY_TRACT
  Filled 2011-12-07: qty 2.5

## 2011-12-07 MED ORDER — NITROGLYCERIN 2 % TD OINT
1.0000 [in_us] | TOPICAL_OINTMENT | Freq: Once | TRANSDERMAL | Status: AC
  Administered 2011-12-08: 1 [in_us] via TOPICAL
  Filled 2011-12-07: qty 30

## 2011-12-07 MED ORDER — NITROGLYCERIN 0.4 MG SL SUBL
SUBLINGUAL_TABLET | SUBLINGUAL | Status: AC
  Administered 2011-12-07: 0.4 mg via SUBLINGUAL
  Filled 2011-12-07: qty 25

## 2011-12-07 MED ORDER — NITROGLYCERIN 0.4 MG SL SUBL
0.4000 mg | SUBLINGUAL_TABLET | SUBLINGUAL | Status: DC | PRN
  Administered 2011-12-07: 0.4 mg via SUBLINGUAL

## 2011-12-07 MED ORDER — ALBUTEROL (5 MG/ML) CONTINUOUS INHALATION SOLN: 10.0000 mg/h | INHALATION_SOLUTION | Freq: Once | RESPIRATORY_TRACT | Status: DC

## 2011-12-07 MED ORDER — ALBUTEROL SULFATE (5 MG/ML) 0.5% IN NEBU
5.0000 mg | INHALATION_SOLUTION | Freq: Once | RESPIRATORY_TRACT | Status: AC
  Administered 2011-12-07: 5 mg via RESPIRATORY_TRACT
  Filled 2011-12-07: qty 1

## 2011-12-07 MED ORDER — ASPIRIN 81 MG PO CHEW
162.0000 mg | CHEWABLE_TABLET | Freq: Once | ORAL | Status: AC
  Administered 2011-12-07: 162 mg via ORAL
  Filled 2011-12-07: qty 2

## 2011-12-07 NOTE — ED Provider Notes (Addendum)
History     CSN: [id number]  Arrival date & time 12/07/11  4098   First MD Initiated Contact with Patient 12/07/11 1905      Chief Complaint  Patient presents with  . Shortness of Breath    (Consider location/radiation/quality/duration/timing/severity/associated sxs/prior treatment) HPI Comments: Pt with reported hx of CHF, asthma, HTN comes in with cc of chest pain, dib, and wheezing. States that she was exposed to some industrial fumes, and started having wheezing and dib. She has had about 4 treatments since this afternoon, and she gets transient relief. She also has associated left sided chest pain, that is intermittent and has no aggravating or relieving factors. Pt has no nausea, diaphoresis, cough.  She reports not being on any meds for CHF. She also reports family hx of premature CAd in the family - her mother died at an early age. She has had stress in 2009, which was negative.   Patient is a 46 y.o. female presenting with shortness of breath. The history is provided by the patient and medical records.  Shortness of Breath  Associated symptoms include chest pain, shortness of breath and wheezing. Pertinent negatives include no stridor and no cough.    Past Medical History  Diagnosis Date  . CHF (congestive heart failure)   . Asthma   . Bronchitis   . Hypertension     Past Surgical History  Procedure Date  . Abdominal hysterectomy   . Hernia repair   . Tubal ligation     No family history on file.  History  Substance Use Topics  . Smoking status: Never Smoker   . Smokeless tobacco: Not on file  . Alcohol Use: Yes     occasionally    OB History    Grav Para Term Preterm Abortions TAB SAB Ect Mult Living                  Review of Systems  Constitutional: Negative for activity change.  HENT: Negative for facial swelling and neck pain.   Respiratory: Positive for shortness of breath and wheezing. Negative for cough and stridor.   Cardiovascular:  Positive for chest pain. Negative for palpitations.  Gastrointestinal: Negative for nausea, vomiting, abdominal pain, diarrhea, constipation, blood in stool and abdominal distention.  Genitourinary: Negative for hematuria and difficulty urinating.  Skin: Negative for color change.  Neurological: Negative for speech difficulty.  Hematological: Does not bruise/bleed easily.  Psychiatric/Behavioral: Negative for confusion.    Allergies  Review of patient's allergies indicates no known allergies.  Home Medications   Current Outpatient Rx  Name Route Sig Dispense Refill  . ALBUTEROL SULFATE (5 MG/ML) 0.5% IN NEBU Nebulization Take 2.5 mg by nebulization every 6 (six) hours as needed. For shortness of breath      BP 143/88  Pulse 101  Temp 97.7 F (36.5 C) (Oral)  Resp 19  SpO2 100%  Physical Exam  Nursing note and vitals reviewed. Constitutional: She is oriented to person, place, and time. She appears well-developed.  HENT:  Head: Normocephalic and atraumatic.  Eyes: Conjunctivae normal and EOM are normal. Pupils are equal, round, and reactive to light.  Neck: Normal range of motion. Neck supple. No JVD present.  Cardiovascular: Regular rhythm and normal heart sounds.   Pulmonary/Chest: Effort normal and breath sounds normal. No respiratory distress. She has no wheezes.  Abdominal: Soft. Bowel sounds are normal. She exhibits no distension. There is no tenderness. There is no rebound and no guarding.  Neurological: She  is alert and oriented to person, place, and time.  Skin: Skin is warm and dry.    ED Course  Procedures (including critical care time)  Labs Reviewed  BASIC METABOLIC PANEL - Abnormal; Notable for the following:    Potassium 3.1 (*)     All other components within normal limits  URINALYSIS, ROUTINE W REFLEX MICROSCOPIC - Abnormal; Notable for the following:    Hgb urine dipstick SMALL (*)     All other components within normal limits  URINE MICROSCOPIC-ADD  ON - Abnormal; Notable for the following:    Squamous Epithelial / LPF FEW (*)     All other components within normal limits  CBC WITH DIFFERENTIAL  TROPONIN I  D-DIMER, QUANTITATIVE  MAGNESIUM  PHOSPHORUS   No results found.   No diagnosis found.    MDM   Date: 12/07/2011  Rate: 103  Rhythm: sinus tachycardia  QRS Axis: normal  Intervals: normal  ST/T Wave abnormalities: NON SPECIFIC ST CHANGES IN MULTIPLE LEADS - AVR, AVL, AVF  Conduction Disutrbances: none  Narrative Interpretation: unremarkable    Date: 12/07/2011  Rate: 118  Rhythm: sinus tachycardia  QRS Axis: normal  Intervals: normal  ST/T Wave abnormalities: nonspecific ST/T changes - SAME AS ABOVE  Conduction Disutrbances:none  Narrative Interpretation:   Old EKG Reviewed: unchanged  Differential diagnosis includes: ACS syndrome CHF exacerbation Asthma exacerbation Valvular disorder Myocarditis Pericarditis Pericardial effusion Pneumonia Pleural effusion Pulmonary edema PE Anemia  Pt comes in with cc of wheezing - however, i had no wheezing on my exam. Pt has been having some intermittent chest pain, sob. She is tachycardic, and tachypeneic. Wells score is 0 - we will get a dimer. Pt reports having CHF, not on lasix, no evidence of overload on exam, CXR pending, will see if there is any pneumonia or pulm edema.  Finally, she has no significant cardiac hx, but with constellation of sx, and ekg that is not reassuring we will consider admitting for ACS workup.  9:52 PM Pt had what appears to be a round of vtach. She was asymptomatic. We will not consult Cardiology - and send over the EKG. Will ask about amiodarone. Repeat EKG shows sinus tachycardia. Continue monitoring,     Derwood Kaplan, MD 12/07/11 2203  10:59 PM Pt got some nitro  -and she is chest pain free now. Lung exam - she has no wheezing, is still a little tight, will start her on continuous nebs and give her some  solumedrol. Finally, Cardiology looked at the EKG, and they think that patient has an unchanged ekg with incomplete RBBB, no active concerns. They will interpret the ekg strip that appears like v-tach and get back to [place]. Admission ordered placed.  Derwood Kaplan, MD 12/07/11 2303  CRITICAL CARE Performed by: Derwood Kaplan   Total critical care time: 45 minutes  Critical care time was exclusive of separately billable procedures and treating other patients.  Critical care was necessary to treat or prevent imminent or life-threatening deterioration.  Critical care was time spent personally by me on the following activities: development of treatment plan with patient and/or surrogate as well as nursing, discussions with consultants, evaluation of patient's response to treatment, examination of patient, obtaining history from patient or surrogate, ordering and performing treatments and interventions, ordering and review of laboratory studies, ordering and review of radiographic studies, pulse oximetry and re-evaluation of patient's condition.   Derwood Kaplan, MD 12/07/11 [phone]

## 2011-12-07 NOTE — ED Notes (Signed)
INFORMED NURSE THAT PT SEEMED TO HAVE A RUN OF V-TACH AND ON SEVERAL OCCASIONS  WAS VERY TACHY IN THE UPPER 140's.

## 2011-12-07 NOTE — ED Notes (Signed)
Rn notified patient had run of V Tach on monitor. RN to bedside. Patient sinus rhythm on monitor when nurse arrived at bedside. VS P103, R 21, BP 143/88, O2 100% on RA. EKG completed.
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
## 2011-12-07 NOTE — Progress Notes (Signed)
RT instructed patient on how to do peak flow and the patient did one pre and post nebulizer tx. Patient did not give much effort and RT attempted to coach patient and encourage her to try harder but the patient still did not give much effort. Peak flow was actually worse post nebulizer treatment. (220-pre nebulizer and 180-post nebulizer) Lung sounds are clear and diminished.

## 2011-12-07 NOTE — ED Notes (Signed)
RT at bedside.

## 2011-12-07 NOTE — ED Notes (Signed)
Patient requesting we call her fiance, Ethelene Browns, and have him come to her bedside. [phone]
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
## 2011-12-07 NOTE — ED Notes (Signed)
MD at bedside. 

## 2011-12-07 NOTE — ED Notes (Signed)
Patient states nitroglycerin relieved chest pain, and heaviness is gone.

## 2011-12-07 NOTE — ED Notes (Signed)
New EKG, Old EKG, & VTach printout given to MD.

## 2011-12-08 DIAGNOSIS — J705 Respiratory conditions due to smoke inhalation: Secondary | ICD-10-CM

## 2011-12-08 DIAGNOSIS — R072 Precordial pain: Secondary | ICD-10-CM

## 2011-12-08 DIAGNOSIS — K219 Gastro-esophageal reflux disease without esophagitis: Secondary | ICD-10-CM

## 2011-12-08 LAB — CBC
HCT: 38.8 % (ref 36.0–46.0)
MCV: 92.8 fL (ref 78.0–100.0)
Platelets: 286 10*3/uL (ref 150–400)
RBC: 4.18 MIL/uL (ref 3.87–5.11)
WBC: 4.4 10*3/uL (ref 4.0–10.5)

## 2011-12-08 LAB — LIPID PANEL
HDL: 91 mg/dL (ref >39)
LDL Cholesterol: 139 mg/dL — ABNORMAL HIGH (ref 0–99)
Triglycerides: 74 mg/dL (ref <150)
VLDL: 15 mg/dL (ref 0–40)

## 2011-12-08 LAB — BASIC METABOLIC PANEL
CO2: 25 mEq/L (ref 19–32)
Chloride: 103 mEq/L (ref 96–112)
Potassium: 3.9 mEq/L (ref 3.5–5.1)
Sodium: 140 mEq/L (ref 135–145)

## 2011-12-08 MED ORDER — NITROGLYCERIN 0.4 MG SL SUBL: 0.4000 mg | SUBLINGUAL_TABLET | SUBLINGUAL | Status: DC | PRN

## 2011-12-08 MED ORDER — SODIUM CHLORIDE 0.9 % IJ SOLN: 3.0000 mL | INTRAMUSCULAR | Status: DC | PRN

## 2011-12-08 MED ORDER — ONDANSETRON HCL 4 MG/2ML IJ SOLN: 4.0000 mg | Freq: Four times a day (QID) | INTRAMUSCULAR | Status: DC | PRN

## 2011-12-08 MED ORDER — SENNOSIDES-DOCUSATE SODIUM 8.6-50 MG PO TABS
1.0000 | ORAL_TABLET | Freq: Every evening | ORAL | Status: DC | PRN
  Filled 2011-12-08: qty 1

## 2011-12-08 MED ORDER — FLUTICASONE PROPIONATE HFA 44 MCG/ACT IN AERO: 1.0000 | INHALATION_SPRAY | Freq: Two times a day (BID) | RESPIRATORY_TRACT | Status: DC

## 2011-12-08 MED ORDER — PREDNISONE 20 MG PO TABS: ORAL_TABLET | ORAL | Status: DC

## 2011-12-08 MED ORDER — ALUM & MAG HYDROXIDE-SIMETH 200-200-20 MG/5ML PO SUSP: 30.0000 mL | Freq: Four times a day (QID) | ORAL | Status: DC | PRN

## 2011-12-08 MED ORDER — IPRATROPIUM BROMIDE 0.02 % IN SOLN: 0.5000 mg | RESPIRATORY_TRACT | Status: DC | PRN

## 2011-12-08 MED ORDER — OMEPRAZOLE 20 MG PO CPDR: 20.0000 mg | DELAYED_RELEASE_CAPSULE | Freq: Every day | ORAL | Status: DC

## 2011-12-08 MED ORDER — ENOXAPARIN SODIUM 40 MG/0.4ML ~~LOC~~ SOLN
40.0000 mg | SUBCUTANEOUS | Status: DC
  Administered 2011-12-08: 40 mg via SUBCUTANEOUS
  Filled 2011-12-08: qty 0.4

## 2011-12-08 MED ORDER — ACETAMINOPHEN 650 MG RE SUPP: 650.0000 mg | Freq: Four times a day (QID) | RECTAL | Status: DC | PRN

## 2011-12-08 MED ORDER — ONDANSETRON HCL 4 MG PO TABS: 4.0000 mg | ORAL_TABLET | Freq: Four times a day (QID) | ORAL | Status: DC | PRN

## 2011-12-08 MED ORDER — ASPIRIN EC 325 MG PO TBEC
325.0000 mg | DELAYED_RELEASE_TABLET | Freq: Every day | ORAL | Status: DC
  Administered 2011-12-08: 325 mg via ORAL
  Filled 2011-12-08: qty 1

## 2011-12-08 MED ORDER — ASPIRIN EC 81 MG PO TBEC: 81.0000 mg | DELAYED_RELEASE_TABLET | Freq: Every day | ORAL | Status: AC

## 2011-12-08 MED ORDER — METOPROLOL TARTRATE 25 MG PO TABS
25.0000 mg | ORAL_TABLET | Freq: Two times a day (BID) | ORAL | Status: DC
  Administered 2011-12-08 (×2): 25 mg via ORAL
  Filled 2011-12-08 (×3): qty 1

## 2011-12-08 MED ORDER — ALBUTEROL SULFATE HFA 108 (90 BASE) MCG/ACT IN AERS: 2.0000 | INHALATION_SPRAY | Freq: Four times a day (QID) | RESPIRATORY_TRACT | Status: DC | PRN

## 2011-12-08 MED ORDER — ALBUTEROL SULFATE (5 MG/ML) 0.5% IN NEBU: INHALATION_SOLUTION | RESPIRATORY_TRACT | Status: DC

## 2011-12-08 MED ORDER — ACETAMINOPHEN 325 MG PO TABS: 650.0000 mg | ORAL_TABLET | Freq: Four times a day (QID) | ORAL | Status: DC | PRN

## 2011-12-08 MED ORDER — PANTOPRAZOLE SODIUM 40 MG PO TBEC
40.0000 mg | DELAYED_RELEASE_TABLET | Freq: Once | ORAL | Status: AC
  Administered 2011-12-08: 40 mg via ORAL
  Filled 2011-12-08: qty 1

## 2011-12-08 MED ORDER — PREDNISONE 20 MG PO TABS
40.0000 mg | ORAL_TABLET | Freq: Two times a day (BID) | ORAL | Status: DC
  Administered 2011-12-08 (×2): 40 mg via ORAL
  Filled 2011-12-08 (×4): qty 2

## 2011-12-08 MED ORDER — INFLUENZA VIRUS VACC SPLIT PF IM SUSP
0.5000 mL | Freq: Once | INTRAMUSCULAR | Status: AC
  Administered 2011-12-08: 0.5 mL via INTRAMUSCULAR
  Filled 2011-12-08: qty 0.5

## 2011-12-08 MED ORDER — ZOLPIDEM TARTRATE 5 MG PO TABS: 5.0000 mg | ORAL_TABLET | Freq: Every evening | ORAL | Status: DC | PRN

## 2011-12-08 MED ORDER — DIPHENHYDRAMINE HCL 25 MG PO CAPS
25.0000 mg | ORAL_CAPSULE | Freq: Once | ORAL | Status: AC
  Administered 2011-12-08: 25 mg via ORAL
  Filled 2011-12-08: qty 1

## 2011-12-08 MED ORDER — ALBUTEROL SULFATE (5 MG/ML) 0.5% IN NEBU: 2.5000 mg | INHALATION_SOLUTION | RESPIRATORY_TRACT | Status: DC | PRN

## 2011-12-08 MED ORDER — INFLUENZA VIRUS VACC SPLIT PF IM SUSP: 0.5000 mL | INTRAMUSCULAR | Status: DC

## 2011-12-08 NOTE — Progress Notes (Signed)
PATIENT DOES NOT HAVE INSURANCE SINCE HER MEDICAID HAS EXPIRED.QUALIFIES FOR INDIGENT FUNDS IF NEEDED.INFORMED OF PHARMACY POLICY OF 3DY SUPPLY/1XYR USE/NO NARCOTICS.PROVIDED W/INDIGENT/SELF PAY PCP LISTING,& $4 [facility] MED LIST.SHE HAS NEBS @ HOME.

## 2011-12-08 NOTE — Progress Notes (Signed)
All d/c instructions reviewed with pt,. Dr. Rito Ehrlich discussed fact that Echo results not back yet.  He will call her with results. Pt pleasant, verb understanding of meds and instructions.
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
## 2011-12-08 NOTE — Care Management Note (Signed)
    Page 1 of 1   12/08/2011     7:13:28 PM   CARE MANAGEMENT NOTE 12/08/2011  Patient:  CHAUNTE, HORNBECK   Account Number:  [id number]  Date Initiated:  12/08/2011  Documentation initiated by:  Lanier Clam  Subjective/Objective Assessment:   ADMITTED W/PE.     Action/Plan:   FROM HOME   Anticipated DC Date:  12/08/2011   Anticipated DC Plan:  HOME/SELF CARE      DC Planning Services  Medication Assistance      Choice offered to / List presented to:             Status of service:  Completed, signed off Medicare Important Message given?   (If response is "NO", the following Medicare IM given date fields will be blank) Date Medicare IM given:   Date Additional Medicare IM given:    Discharge Disposition:  HOME/SELF CARE  Per UR Regulation:  Reviewed for med. necessity/level of care/duration of stay  If discussed at Long Length of Stay Meetings, dates discussed:    Comments:  12/08/11 Teosha Casso RN,BSN NCM [phone] QUALIFES FOR INDIGENT FUNDS,USED THROUGH OUR PHARMACY.INFORMED OF 3DY/1XUSE/NO NARCOTICS.

## 2011-12-08 NOTE — Progress Notes (Signed)
  Echocardiogram 2D Echocardiogram has been performed.  Cobi Aldape 12/08/2011, 8:45 AM

## 2012-01-01 ENCOUNTER — Emergency Department (HOSPITAL_COMMUNITY): Payer: Self-pay

## 2012-01-01 ENCOUNTER — Emergency Department (HOSPITAL_COMMUNITY)
Admission: EM | Admit: 2012-01-01 | Discharge: 2012-01-01 | Payer: Self-pay | Attending: Emergency Medicine | Admitting: Emergency Medicine

## 2012-01-01 ENCOUNTER — Encounter (HOSPITAL_COMMUNITY): Payer: Self-pay | Admitting: *Deleted

## 2012-01-01 ENCOUNTER — Encounter (HOSPITAL_COMMUNITY): Payer: Self-pay | Admitting: Emergency Medicine

## 2012-01-01 DIAGNOSIS — F10929 Alcohol use, unspecified with intoxication, unspecified: Secondary | ICD-10-CM

## 2012-01-01 DIAGNOSIS — F1010 Alcohol abuse, uncomplicated: Secondary | ICD-10-CM | POA: Insufficient documentation

## 2012-01-01 DIAGNOSIS — R079 Chest pain, unspecified: Secondary | ICD-10-CM | POA: Insufficient documentation

## 2012-01-01 HISTORY — DX: Unspecified asthma, uncomplicated: J45.909

## 2012-01-01 HISTORY — DX: Atherosclerotic heart disease of native coronary artery without angina pectoris: I25.10

## 2012-01-01 LAB — COMPREHENSIVE METABOLIC PANEL
Albumin: 3.9 g/dL (ref 3.5–5.2)
BUN: 13 mg/dL (ref 6–23)
Calcium: 9 mg/dL (ref 8.4–10.5)
GFR calc Af Amer: >90 mL/min (ref >90)
Glucose, Bld: 95 mg/dL (ref 70–99)
Sodium: 142 mEq/L (ref 135–145)
Total Protein: 7.6 g/dL (ref 6.0–8.3)

## 2012-01-01 LAB — CBC WITH DIFFERENTIAL/PLATELET
Basophils Relative: 0 % (ref 0–1)
Eosinophils Absolute: 0 10*3/uL (ref 0.0–0.7)
Eosinophils Relative: 1 % (ref 0–5)
Lymphs Abs: 2.5 10*3/uL (ref 0.7–4.0)
MCH: 30.9 pg (ref 26.0–34.0)
MCHC: 33.8 g/dL (ref 30.0–36.0)
MCV: 91.3 fL (ref 78.0–100.0)
Monocytes Relative: 4 % (ref 3–12)
Platelets: 292 10*3/uL (ref 150–400)
RBC: 4.47 MIL/uL (ref 3.87–5.11)

## 2012-01-01 LAB — RAPID URINE DRUG SCREEN, HOSP PERFORMED
Cocaine: NOT DETECTED
Opiates: NOT DETECTED

## 2012-01-01 LAB — TROPONIN I: Troponin I: <0.3 ng/mL (ref <0.30)
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
## 2012-01-01 NOTE — ED Notes (Signed)
Assisted pt with phone call to her boyfriend [place].  Informed Reita Cliche where pt is located and where car is located per her request.

## 2012-02-05 IMAGING — CR DG LUMBAR SPINE COMPLETE 4+V
5 series · 5 of 5 positions shown · non-contrast
Comparison: None.

CLINICAL DATA: Fall

LUMBAR SPINE - COMPLETE 4+ VIEW

[t l-spine a.p.]
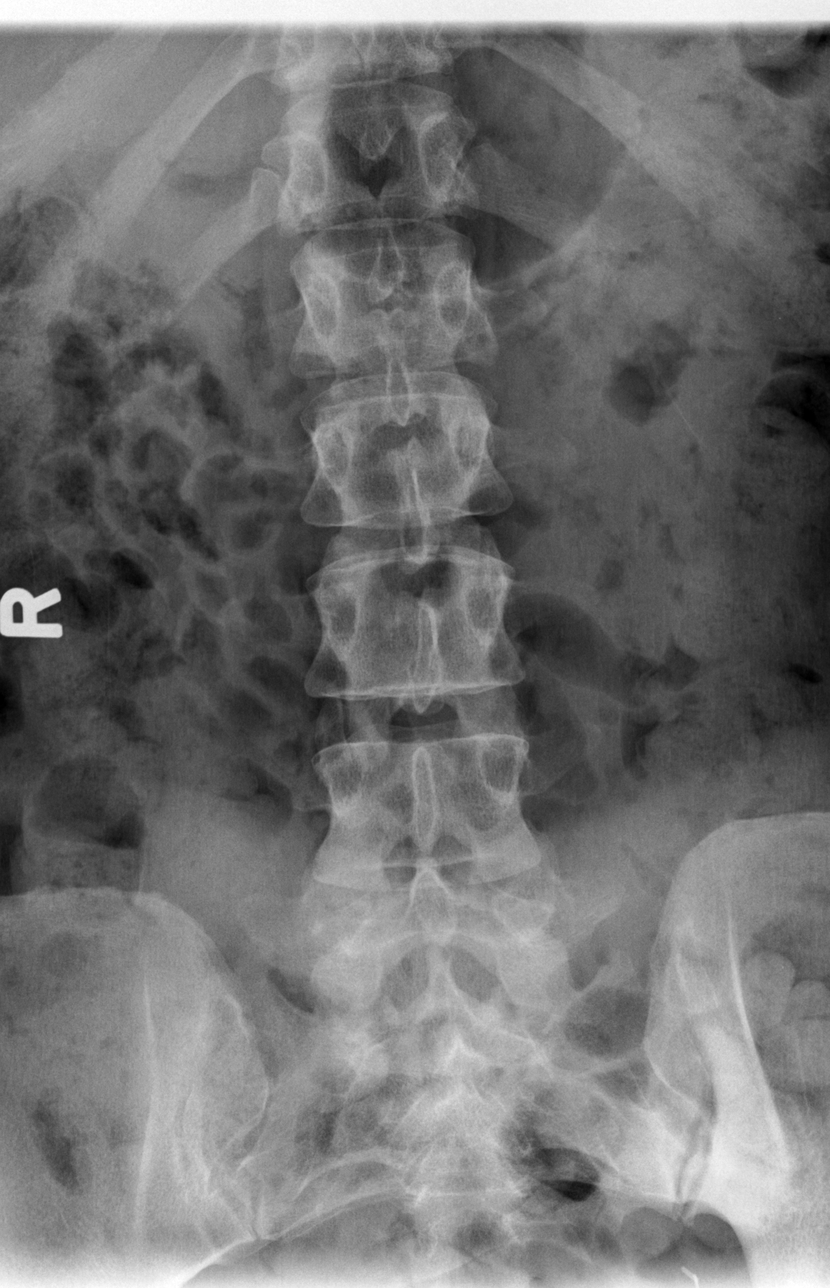

[t l-spine oblique exposure (1 of 2)]
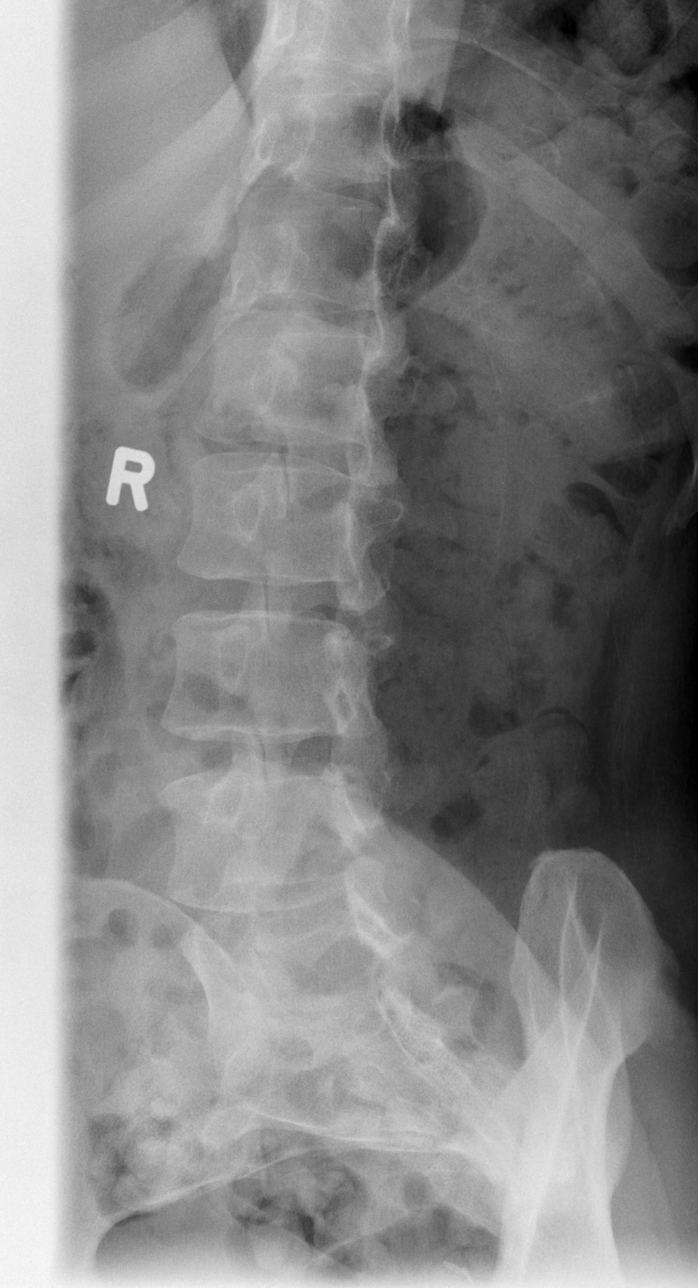

[t l-spine oblique exposure (2 of 2)]
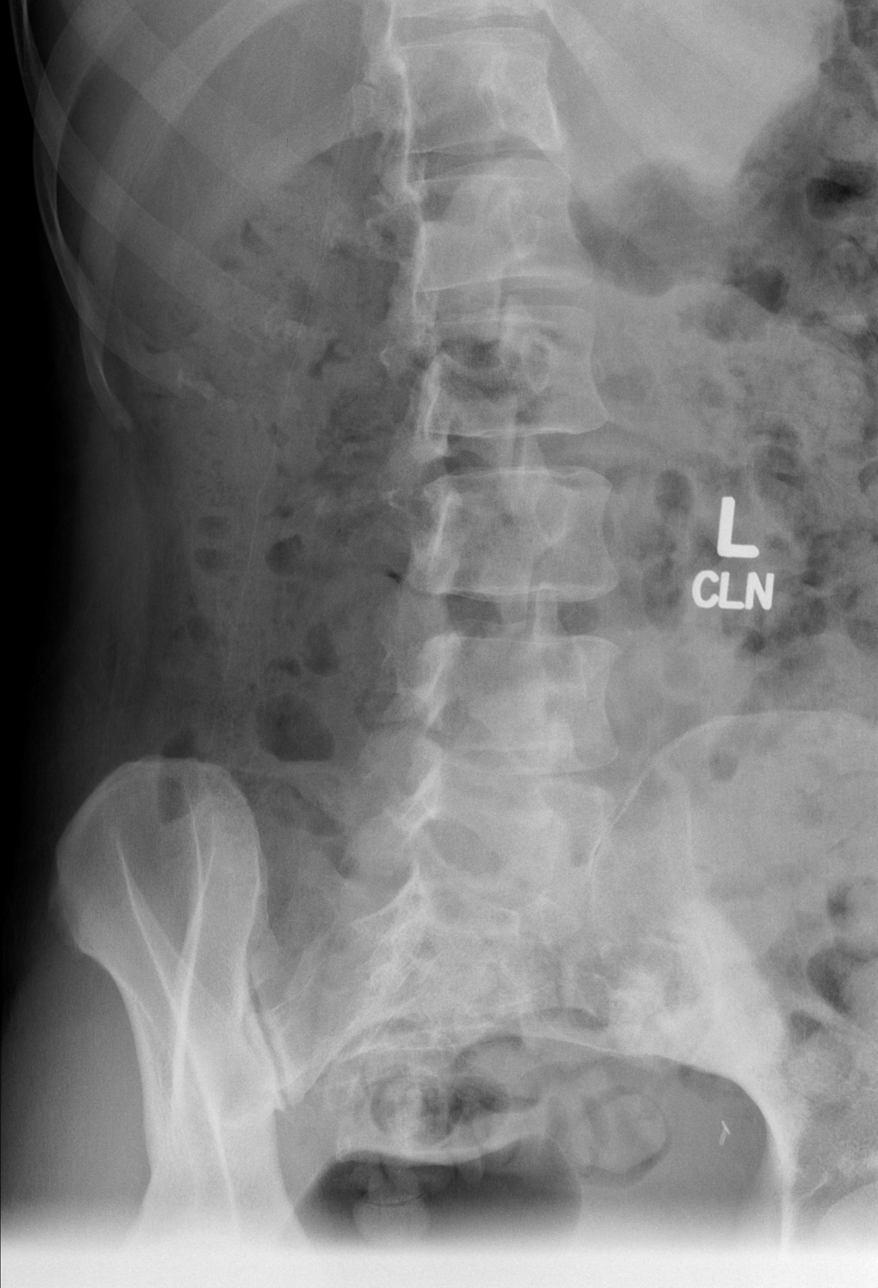

[t l-spine lat]
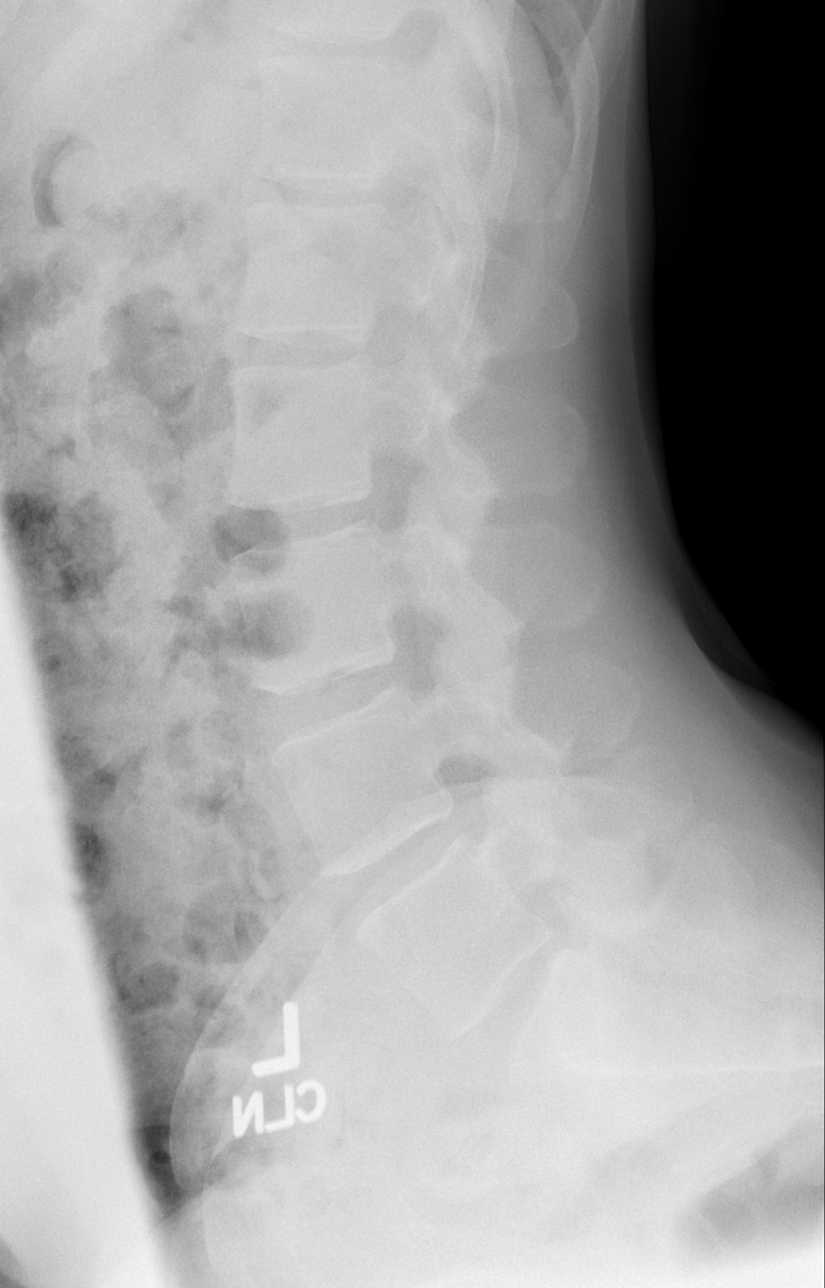

[t l-spine l5-s1 spot]
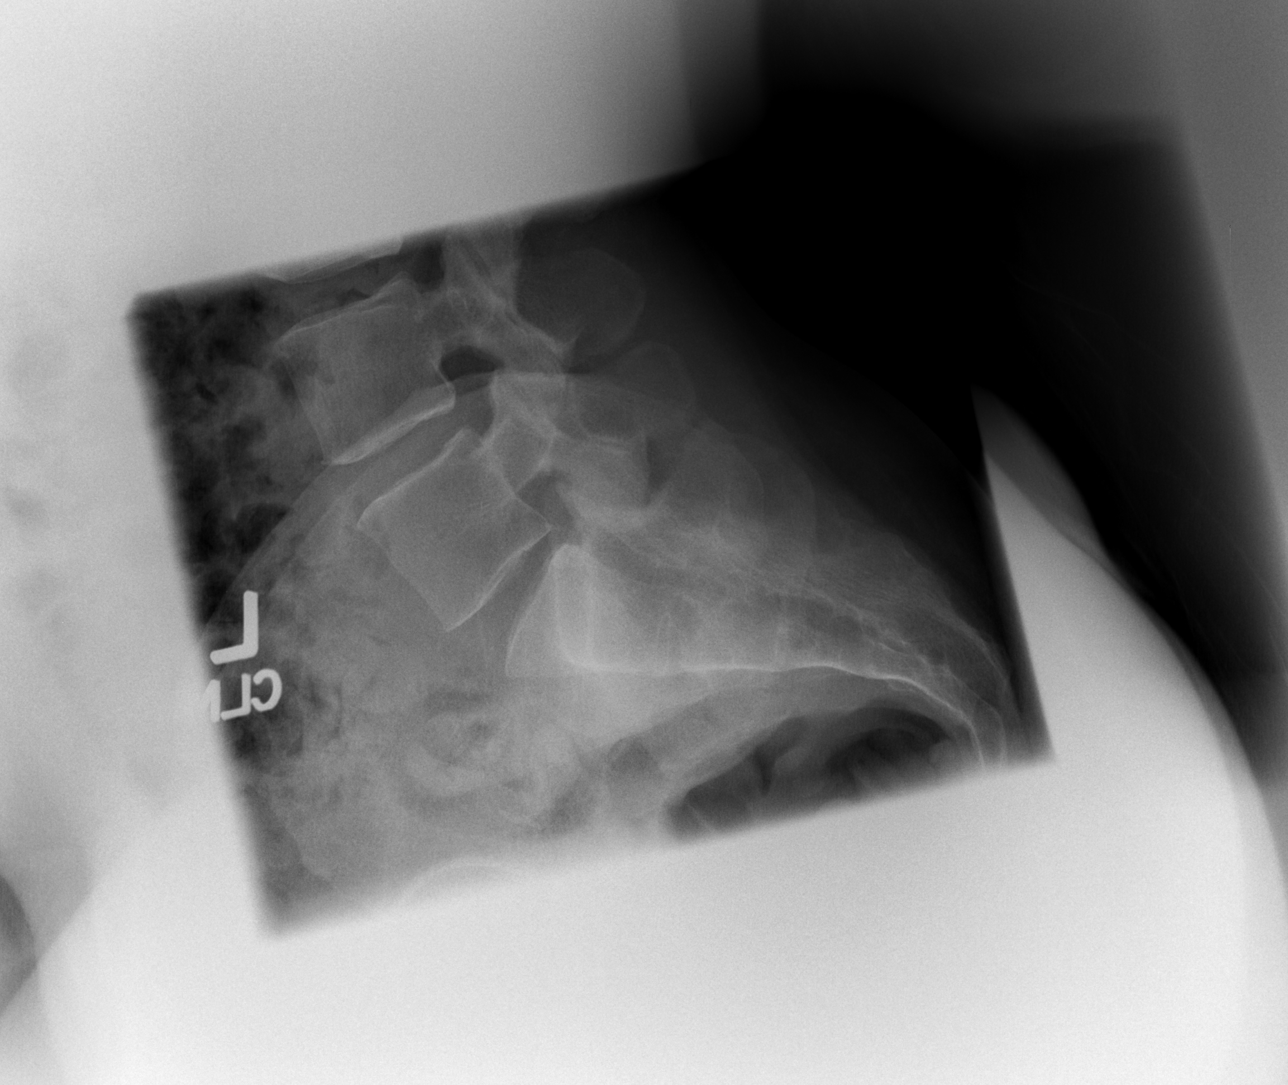

[5 of 5 positions shown; findings below may reference images not displayed]

FINDINGS: Anatomic alignment.  No vertebral body height loss.  No
acute fracture.  Mild narrowing of the L4-5 disc.
IMPRESSION: No acute bony pathology.

## 2012-09-24 ENCOUNTER — Emergency Department (HOSPITAL_COMMUNITY)
Admission: EM | Admit: 2012-09-24 | Discharge: 2012-09-25 | Disposition: A | Discharge status: Home / Self Care | Payer: Medicaid Other | Attending: Emergency Medicine | Admitting: Emergency Medicine

## 2012-09-24 ENCOUNTER — Encounter (HOSPITAL_COMMUNITY): Payer: Self-pay | Admitting: Adult Health

## 2012-09-24 DIAGNOSIS — Z79899 Other long term (current) drug therapy: Secondary | ICD-10-CM | POA: Insufficient documentation

## 2012-09-24 DIAGNOSIS — R238 Other skin changes: Secondary | ICD-10-CM

## 2012-09-24 DIAGNOSIS — Z77098 Contact with and (suspected) exposure to other hazardous, chiefly nonmedicinal, chemicals: Secondary | ICD-10-CM | POA: Insufficient documentation

## 2012-09-24 DIAGNOSIS — H538 Other visual disturbances: Secondary | ICD-10-CM | POA: Insufficient documentation

## 2012-09-24 DIAGNOSIS — I10 Essential (primary) hypertension: Secondary | ICD-10-CM | POA: Insufficient documentation

## 2012-09-24 DIAGNOSIS — I509 Heart failure, unspecified: Secondary | ICD-10-CM | POA: Insufficient documentation

## 2012-09-24 DIAGNOSIS — J45909 Unspecified asthma, uncomplicated: Secondary | ICD-10-CM | POA: Insufficient documentation

## 2012-09-24 DIAGNOSIS — I2510 Atherosclerotic heart disease of native coronary artery without angina pectoris: Secondary | ICD-10-CM | POA: Insufficient documentation

## 2012-09-24 DIAGNOSIS — Z7982 Long term (current) use of aspirin: Secondary | ICD-10-CM | POA: Insufficient documentation

## 2012-09-24 DIAGNOSIS — L299 Pruritus, unspecified: Secondary | ICD-10-CM | POA: Insufficient documentation
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
## 2012-09-25 MED ORDER — TETRACAINE HCL 0.5 % OP SOLN
2.0000 [drp] | Freq: Once | OPHTHALMIC | Status: AC
  Administered 2012-09-25: 2 [drp] via OPHTHALMIC
  Filled 2012-09-25: qty 2

## 2012-09-25 MED ORDER — FLUORESCEIN SODIUM 1 MG OP STRP
1.0000 | ORAL_STRIP | Freq: Once | OPHTHALMIC | Status: AC
  Administered 2012-09-25: 1 via OPHTHALMIC
  Filled 2012-09-25: qty 1

## 2012-09-25 MED ORDER — DIPHENHYDRAMINE HCL 25 MG PO CAPS
25.0000 mg | ORAL_CAPSULE | Freq: Once | ORAL | Status: AC
  Administered 2012-09-25: 25 mg via ORAL
  Filled 2012-09-25: qty 1
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
## 2012-09-25 NOTE — ED Notes (Signed)
Moved to eye room for slit lamp examination.
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
## 2012-09-25 NOTE — ED Provider Notes (Signed)
CSN: [id number]     Arrival date & time 09/24/12  2341 History     First MD Initiated Contact with Patient 09/24/12 2352     Chief Complaint  Patient presents with  . Chemical Exposure   HPI  History provided by the patient. Patient is a 47 year old female with history of asthma, CHF and hypertension who is in prescription irritations from chemical exposure. Patient was shopping at the store and buying a roach poison. While in line she opened the packaging and a powder and into the air coming in contact with her face and skin on arms and lower legs. Patient states initially she not think much of this and returned home and is normal. The initial episode occurred around 8 PM. As time has passed she has had some irritation and itching to the skin of her legs and arms. She also complains of some burning to her face. She feels as though she has slight blurred vision as well but is not sure. She denies significant tearing. There has not been any redness to the eyes. Denies back pain. She did not attempt to rinse the skin or use any treatments. She states that the roach poison contained boric acid. Unsure of any other contents. No other aggravating or alleviating factors. Denies any associated pain of the throat or shortness of breath.    Past Medical History  Diagnosis Date  . Asthma   . CHF (congestive heart failure)   . Coronary artery disease     MI 2007  . Asthma   . Bronchitis   . Hypertension    Past Surgical History  Procedure Laterality Date  . Abdominal hysterectomy    . Tubal ligation    . Hernia repair    . Tubal ligation      R only   Family History  Problem Relation Age of Onset  . Coronary artery disease     History  Substance Use Topics  . Smoking status: Never Smoker   . Smokeless tobacco: Not on file  . Alcohol Use: Yes     Comment: occasionally   OB History   Grav Para Term Preterm Abortions TAB SAB Ect Mult Living                 Review of Systems  Eyes:  Positive for itching. Negative for photophobia, pain and redness.  Respiratory: Negative for shortness of breath and stridor.   Skin:       Irritation in Burning  Neurological: Negative for headaches.  All other systems reviewed and are negative.    Allergies  Review of patient's allergies indicates no known allergies.  Home Medications   Current Outpatient Rx  Name  Route  Sig  Dispense  Refill  . albuterol (PROVENTIL HFA;VENTOLIN HFA) 108 (90 BASE) MCG/ACT inhaler   Inhalation   Inhale 2 puffs into the lungs every 6 (six) hours as needed for wheezing.   1 Inhaler   2   . albuterol (PROVENTIL) (5 MG/ML) 0.5% nebulizer solution      Take one dose every 6 hours for 2 days and then as needed for shortness of breath   20 mL   1   . aspirin EC 81 MG tablet   Oral   Take 1 tablet (81 mg total) by mouth daily.   30 tablet   0   . fluticasone (FLOVENT HFA) 44 MCG/ACT inhaler   Inhalation   Inhale 1 puff into the lungs 2 (two)  times daily.   1 Inhaler   12    BP 146/98  Pulse 115  Temp(Src) 99.1 F (37.3 C) (Oral)  Resp 16  SpO2 99% Physical Exam  Nursing note and vitals reviewed. Constitutional: She is oriented to person, place, and time. She appears well-developed and well-nourished. No distress.  HENT:  Head: Normocephalic.  Eyes: Conjunctivae and EOM are normal. Pupils are equal, round, and reactive to light.  Slit lamp exam:      The right eye shows no corneal abrasion and no corneal ulcer.       The left eye shows no corneal abrasion and no corneal ulcer.  Neck: Normal range of motion. Neck supple.  Cardiovascular: Normal rate and regular rhythm.   Pulmonary/Chest: Effort normal and breath sounds normal. No stridor. No respiratory distress. She has no wheezes. She has no rales.  Abdominal: Soft.  Musculoskeletal: Normal range of motion. She exhibits no edema and no tenderness.  Neurological: She is alert and oriented to person, place, and time.  Skin: Skin  is warm and dry. No rash noted. No erythema.  Psychiatric: She has a normal mood and affect. Her behavior is normal.    ED Course   Procedures      1. Chemical exposure of eye   2. Skin irritation     MDM  12:10 AM patient seen and evaluated. Patient appears well in no acute distress. Eyes appear unremarkable without erythema or tearing. Skin also without any redness or rash.  Patient's skin, face and eyes were rinsed with water for irrigation.  No concerning exam findings.  No significant signs of irritation to eyes.  No rash of skin.  No erythema.   Angus Seller, PA-C 09/26/12 [phone]

## 2012-09-27 NOTE — ED Provider Notes (Signed)
Medical screening examination/treatment/procedure(s) were performed by non-physician practitioner and as supervising physician I was immediately available for consultation/collaboration.  Melvin Marmo M Khristopher Kapaun, MD 09/27/12 0742 

## 2012-11-05 ENCOUNTER — Emergency Department (HOSPITAL_BASED_OUTPATIENT_CLINIC_OR_DEPARTMENT_OTHER): Payer: Medicaid Other

## 2012-11-05 ENCOUNTER — Emergency Department (HOSPITAL_BASED_OUTPATIENT_CLINIC_OR_DEPARTMENT_OTHER)
Admission: EM | Admit: 2012-11-05 | Discharge: 2012-11-05 | Disposition: A | Discharge status: Home / Self Care | Payer: Medicaid Other | Attending: Emergency Medicine | Admitting: Emergency Medicine

## 2012-11-05 ENCOUNTER — Encounter (HOSPITAL_BASED_OUTPATIENT_CLINIC_OR_DEPARTMENT_OTHER): Payer: Self-pay | Admitting: *Deleted

## 2012-11-05 DIAGNOSIS — Z7982 Long term (current) use of aspirin: Secondary | ICD-10-CM | POA: Insufficient documentation

## 2012-11-05 DIAGNOSIS — I10 Essential (primary) hypertension: Secondary | ICD-10-CM | POA: Insufficient documentation

## 2012-11-05 DIAGNOSIS — M25539 Pain in unspecified wrist: Secondary | ICD-10-CM | POA: Insufficient documentation

## 2012-11-05 DIAGNOSIS — M25551 Pain in right hip: Secondary | ICD-10-CM

## 2012-11-05 DIAGNOSIS — J45909 Unspecified asthma, uncomplicated: Secondary | ICD-10-CM | POA: Insufficient documentation

## 2012-11-05 DIAGNOSIS — M25559 Pain in unspecified hip: Secondary | ICD-10-CM | POA: Insufficient documentation

## 2012-11-05 DIAGNOSIS — Z79899 Other long term (current) drug therapy: Secondary | ICD-10-CM | POA: Insufficient documentation

## 2012-11-05 DIAGNOSIS — IMO0002 Reserved for concepts with insufficient information to code with codable children: Secondary | ICD-10-CM | POA: Insufficient documentation

## 2012-11-05 DIAGNOSIS — R52 Pain, unspecified: Secondary | ICD-10-CM | POA: Insufficient documentation

## 2012-11-05 DIAGNOSIS — M25532 Pain in left wrist: Secondary | ICD-10-CM

## 2012-11-05 DIAGNOSIS — I509 Heart failure, unspecified: Secondary | ICD-10-CM | POA: Insufficient documentation

## 2012-11-05 DIAGNOSIS — I2510 Atherosclerotic heart disease of native coronary artery without angina pectoris: Secondary | ICD-10-CM | POA: Insufficient documentation

## 2012-11-05 LAB — CBC
Hemoglobin: 12.2 g/dL (ref 12.0–15.0)
MCH: 31.1 pg (ref 26.0–34.0)
Platelets: 245 10*3/uL (ref 150–400)
RBC: 3.92 MIL/uL (ref 3.87–5.11)
WBC: 5.1 10*3/uL (ref 4.0–10.5)

## 2012-11-05 LAB — BASIC METABOLIC PANEL
CO2: 30 mEq/L (ref 19–32)
Calcium: 9 mg/dL (ref 8.4–10.5)
Chloride: 106 mEq/L (ref 96–112)
Glucose, Bld: 115 mg/dL — ABNORMAL HIGH (ref 70–99)
Potassium: 3.2 mEq/L — ABNORMAL LOW (ref 3.5–5.1)
Sodium: 142 mEq/L (ref 135–145)

## 2012-11-05 MED ORDER — CEPHALEXIN 250 MG PO CAPS
500.0000 mg | ORAL_CAPSULE | Freq: Once | ORAL | Status: AC
  Administered 2012-11-05: 500 mg via ORAL
  Filled 2012-11-05: qty 2

## 2012-11-05 MED ORDER — CEPHALEXIN 500 MG PO CAPS: 500.0000 mg | ORAL_CAPSULE | Freq: Four times a day (QID) | ORAL | Status: DC

## 2012-11-05 MED ORDER — IBUPROFEN 800 MG PO TABS: 800.0000 mg | ORAL_TABLET | Freq: Three times a day (TID) | ORAL | Status: DC

## 2012-11-05 MED ORDER — HYDROCODONE-ACETAMINOPHEN 5-325 MG PO TABS: 1.0000 | ORAL_TABLET | Freq: Four times a day (QID) | ORAL | Status: DC | PRN

## 2012-11-05 MED ORDER — HYDROCODONE-ACETAMINOPHEN 5-325 MG PO TABS
2.0000 | ORAL_TABLET | Freq: Once | ORAL | Status: AC
  Administered 2012-11-05: 2 via ORAL
  Filled 2012-11-05: qty 2

## 2012-11-05 NOTE — ED Provider Notes (Signed)
CSN: [id number]     Arrival date & time 11/05/12  1408 History   First MD Initiated Contact with Patient 11/05/12 1419     Chief Complaint  Patient presents with  . Wrist Pain   (Consider location/radiation/quality/duration/timing/severity/associated sxs/prior Treatment) Patient is a 47 y.o. female presenting with wrist pain. The history is provided by the patient.  Wrist Pain This is a new problem. The current episode started 2 days ago. The problem occurs constantly. The problem has not changed since onset.Pertinent negatives include no chest pain, no abdominal pain, no headaches and no shortness of breath. Exacerbated by: moving the wrist. Nothing relieves the symptoms.    Past Medical History  Diagnosis Date  . Asthma   . CHF (congestive heart failure)   . Coronary artery disease     MI 2007  . Asthma   . Bronchitis   . Hypertension    Past Surgical History  Procedure Laterality Date  . Abdominal hysterectomy    . Tubal ligation    . Hernia repair    . Tubal ligation      R only   Family History  Problem Relation Age of Onset  . Coronary artery disease     History  Substance Use Topics  . Smoking status: Never Smoker   . Smokeless tobacco: Not on file  . Alcohol Use: Yes     Comment: occasionally   OB History   Grav Para Term Preterm Abortions TAB SAB Ect Mult Living                 Review of Systems  Constitutional: Negative for fever and chills.  Respiratory: Negative for cough and shortness of breath.   Cardiovascular: Negative for chest pain.  Gastrointestinal: Negative for vomiting and abdominal pain.  Genitourinary: Negative for flank pain and vaginal discharge.  Neurological: Negative for headaches.  All other systems reviewed and are negative.    Allergies  Review of patient's allergies indicates no known allergies.  Home Medications   Current Outpatient Rx  Name  Route  Sig  Dispense  Refill  . albuterol (PROVENTIL HFA;VENTOLIN HFA) 108  (90 BASE) MCG/ACT inhaler   Inhalation   Inhale 2 puffs into the lungs every 6 (six) hours as needed for wheezing.   1 Inhaler   2   . albuterol (PROVENTIL) (5 MG/ML) 0.5% nebulizer solution      Take one dose every 6 hours for 2 days and then as needed for shortness of breath   20 mL   1   . aspirin EC 81 MG tablet   Oral   Take 1 tablet (81 mg total) by mouth daily.   30 tablet   0   . fluticasone (FLOVENT HFA) 44 MCG/ACT inhaler   Inhalation   Inhale 1 puff into the lungs 2 (two) times daily.   1 Inhaler   12    BP 136/88  Pulse 96  Temp(Src) 99 F (37.2 C) (Oral)  Resp 16  Ht 5\' 6"  (1.676 m)  Wt 180 lb (81.647 kg)  BMI 29.07 kg/m2  SpO2 99%  LMP 11/05/2012 Physical Exam  Nursing note and vitals reviewed. Constitutional: She is oriented to person, place, and time. She appears well-developed and well-nourished. No distress.  HENT:  Head: Normocephalic and atraumatic.  Eyes: EOM are normal. Pupils are equal, round, and reactive to light.  Neck: Normal range of motion. Neck supple.  Cardiovascular: Normal rate and regular rhythm.  Exam reveals no  friction rub.   No murmur heard. Pulmonary/Chest: Effort normal and breath sounds normal. No respiratory distress. She has no wheezes. She has no rales.  Abdominal: Soft. She exhibits no distension. There is no tenderness. There is no rebound.  Musculoskeletal: She exhibits no edema.       Left elbow: Normal.       Left wrist: She exhibits decreased range of motion, tenderness and swelling. She exhibits no effusion.       Right hip: She exhibits tenderness (R groin, no bony tenderness). She exhibits normal range of motion, normal strength, no bony tenderness and no deformity.  Neurological: She is alert and oriented to person, place, and time.  Skin: She is not diaphoretic.    ED Course  Procedures (including critical care time) Labs Review Labs Reviewed  CBC  BASIC METABOLIC PANEL   Imaging Review Dg Wrist 2  Views Left  11/05/2012   CLINICAL DATA:  Pain  EXAM: LEFT WRIST - 2 VIEW  COMPARISON:  None.  FINDINGS: Frontal and lateral views were obtained. There is no fracture or dislocation. Joint spaces appear intact. No erosive change.  IMPRESSION: No appreciable fracture or dislocation. No appreciable arthropathy.   Electronically Signed   By: Bretta Bang   On: 11/05/2012 15:19   Dg Hip Complete Right  11/05/2012   CLINICAL DATA:  Pain  EXAM: RIGHT HIP - COMPLETE 2+ VIEW  COMPARISON:  None.  FINDINGS: Frontal pelvis as well as frontal and lateral right hip images were obtained. There is mild symmetric narrowing of both hip joints. There is bony overgrowth along both lateral acetabuli. There is no fracture or dislocation. No erosive change. There are clips in the left mid pelvis.  IMPRESSION: Symmetric narrowing of both hip joints. Symmetric bony overgrowth along both lateral acetabuli. 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 Clinical evaluation in this regard may be reasonable.   Electronically Signed   By: Bretta Bang   On: 11/05/2012 15:20    MDM   1. Wrist pain, acute, left   2. Acute hip pain, right    45F with hx of CHF, asthma presents with acute onset L wrist pain 2 days ago. No known trauma. No hx of gout or crystalline arthropathy. Patient denies fevers, abdominal pain. 2 episodes of vomiting 2 days ago. AFVSS here. L wrist mildly swollen and warm to the touch. No streaking redness or cellulitis. Patient has redness to dorsal surface of hand. R hip also with pain. No pain with ROM, mostly states pain is in R groin. No swelling noted, no inguinal hernia noted. Denies trauma to R hip. Will check basic labs and xray her L wrist and R hip. Denies vaginal discharge, fevers, rash, doubt disseminated gonococcemia No white count. I spoke with patient about possible arthrocentesis for eval for septic joint vs gout. I also discussed possibly doing conservative  therapy and holding off on joint aspiration at this time since she has a normal white count and little to no effusion. Patient declined aspiration at this time. Will give small amount of vicodin and NSAIDs and Keflex for possible cellulitis vs gout. Instructed to f/u with PCP as scheduled in 1 week and return if problems arise sooner.    Dagmar Hait, MD 11/05/12 [id number]

## 2012-11-05 NOTE — ED Notes (Signed)
Pt c/o left wrist pain and right hip pain w/o injury x 2 days

## 2013-06-12 ENCOUNTER — Encounter (HOSPITAL_COMMUNITY): Payer: Self-pay | Admitting: Emergency Medicine

## 2013-06-12 ENCOUNTER — Emergency Department (INDEPENDENT_AMBULATORY_CARE_PROVIDER_SITE_OTHER)
Admission: EM | Admit: 2013-06-12 | Discharge: 2013-06-12 | Disposition: A | Discharge status: Home / Self Care | Payer: Self-pay | Source: Home / Self Care | Attending: Family Medicine | Admitting: Family Medicine

## 2013-06-12 DIAGNOSIS — J45909 Unspecified asthma, uncomplicated: Secondary | ICD-10-CM

## 2013-06-12 DIAGNOSIS — I10 Essential (primary) hypertension: Secondary | ICD-10-CM

## 2013-06-12 MED ORDER — PREDNISONE 50 MG PO TABS: 50.0000 mg | ORAL_TABLET | Freq: Every day | ORAL | Status: DC

## 2013-06-12 MED ORDER — ALBUTEROL SULFATE HFA 108 (90 BASE) MCG/ACT IN AERS: 2.0000 | INHALATION_SPRAY | Freq: Four times a day (QID) | RESPIRATORY_TRACT | Status: AC | PRN

## 2013-06-12 MED ORDER — IPRATROPIUM-ALBUTEROL 0.5-2.5 (3) MG/3ML IN SOLN
3.0000 mL | Freq: Once | RESPIRATORY_TRACT | Status: AC
  Administered 2013-06-12: 3 mL via RESPIRATORY_TRACT

## 2013-06-12 MED ORDER — AMLODIPINE BESYLATE 10 MG PO TABS: 10.0000 mg | ORAL_TABLET | Freq: Every day | ORAL | Status: DC

## 2013-06-12 MED ORDER — IPRATROPIUM-ALBUTEROL 0.5-2.5 (3) MG/3ML IN SOLN
RESPIRATORY_TRACT | Status: AC
  Filled 2013-06-12: qty 3

## 2013-06-12 MED ORDER — ALBUTEROL SULFATE (5 MG/ML) 0.5% IN NEBU: INHALATION_SOLUTION | RESPIRATORY_TRACT | Status: DC

## 2013-06-12 NOTE — Discharge Instructions (Signed)
Thank you for coming in today. Take prednisone daily for 5 days.  Use albuterol as needed.  Take amlodipine daily for blood pressure.  Follow up with your medicaid doctor ASAP.  Call or go to the emergency room if you get worse, have trouble breathing, have chest pains, or palpitations.   Asthma Attack Prevention Although there is no way to prevent asthma from starting, you can take steps to control the disease and reduce its symptoms. Learn about your asthma and how to control it. Take an active role to control your asthma by working with your health care provider to create and follow an asthma action plan. An asthma action plan guides you in:  Taking your medicines properly.  Avoiding things that set off your asthma or make your asthma worse (asthma triggers).  Tracking your level of asthma control.  Responding to worsening asthma.  Seeking emergency care when needed. To track your asthma, keep records of your symptoms, check your peak flow number using a handheld device that shows how well air moves out of your lungs (peak flow meter), and get regular asthma checkups.  WHAT ARE SOME WAYS TO PREVENT AN ASTHMA ATTACK?  Take medicines as directed by your health care provider.  Keep track of your asthma symptoms and level of control.  With your health care provider, write a detailed plan for taking medicines and managing an asthma attack. Then be sure to follow your action plan. Asthma is an ongoing condition that needs regular monitoring and treatment.  Identify and avoid asthma triggers. Many outdoor allergens and irritants (such as pollen, mold, cold air, and air pollution) can trigger asthma attacks. Find out what your asthma triggers are and take steps to avoid them.  Monitor your breathing. Learn to recognize warning signs of an attack, such as coughing, wheezing, or shortness of breath. Your lung function may decrease before you notice any signs or symptoms, so regularly measure  and record your peak airflow with a home peak flow meter.  Identify and treat attacks early. If you act quickly, you are less likely to have a severe attack. You will also need less medicine to control your symptoms. When your peak flow measurements decrease and alert you to an upcoming attack, take your medicine as instructed and immediately stop any activity that may have triggered the attack. If your symptoms do not improve, get medical help.  Pay attention to increasing quick-relief inhaler use. If you find yourself relying on your quick-relief inhaler, your asthma is not under control. See your health care provider about adjusting your treatment. WHAT CAN MAKE MY SYMPTOMS WORSE? A number of common things can set off or make your asthma symptoms worse and cause temporary increased inflammation of your airways. Keep track of your asthma symptoms for several weeks, detailing all the environmental and emotional factors that are linked with your asthma. When you have an asthma attack, go back to your asthma diary to see which factor, or combination of factors, might have contributed to it. Once you know what these factors are, you can take steps to control many of them. If you have allergies and asthma, it is important to take asthma prevention steps at home. Minimizing contact with the substance to which you are allergic will help prevent an asthma attack. Some triggers and ways to avoid these triggers are: Animal Dander:  Some people are allergic to the flakes of skin or dried saliva from animals with fur or feathers.   There is  no such thing as a hypoallergenic dog or cat breed. All dogs or cats can cause allergies, even if they don't shed.  Keep these pets out of your home.  If you are not able to keep a pet outdoors, keep the pet out of your bedroom and other sleeping areas at all times, and keep the door closed.  Remove carpets and furniture covered with cloth from your home. If that is not  possible, keep the pet away from fabric-covered furniture and carpets. Dust Mites: Many people with asthma are allergic to dust mites. Dust mites are tiny bugs that are found in every home in mattresses, pillows, carpets, fabric-covered furniture, bedcovers, clothes, stuffed toys, and other fabric-covered items.   Cover your mattress in a special dust-proof cover.  Cover your pillow in a special dust-proof cover, or wash the pillow each week in hot water. Water must be hotter than 130 F (54.4 C) to kill dust mites. Cold or warm water used with detergent and bleach can also be effective.  Wash the sheets and blankets on your bed each week in hot water.  Try not to sleep or lie on cloth-covered cushions.  Call ahead when traveling and ask for a smoke-free hotel room. Bring your own bedding and pillows in case the hotel only supplies feather pillows and down comforters, which may contain dust mites and cause asthma symptoms.  Remove carpets from your bedroom and those laid on concrete, if you can.  Keep stuffed toys out of the bed, or wash the toys weekly in hot water or cooler water with detergent and bleach. Cockroaches: Many people with asthma are allergic to the droppings and remains of cockroaches.   Keep food and garbage in closed containers. Never leave food out.  Use poison baits, traps, powders, gels, or paste (for example, boric acid).  If a spray is used to kill cockroaches, stay out of the room until the odor goes away. Indoor Mold:  Fix leaky faucets, pipes, or other sources of water that have mold around them.  Clean floors and moldy surfaces with a fungicide or diluted bleach.  Avoid using humidifiers, vaporizers, or swamp coolers. These can spread molds through the air. Pollen and Outdoor Mold:  When pollen or mold spore counts are high, try to keep your windows closed.  Stay indoors with windows closed from late morning to afternoon. Pollen and some mold spore  counts are highest at that time.  Ask your health care provider whether you need to take anti-inflammatory medicine or increase your dose of the medicine before your allergy season starts. Other Irritants to Avoid:  Tobacco smoke is an irritant. If you smoke, ask your health care provider how you can quit. Ask family members to quit smoking too. Do not allow smoking in your home or car.  If possible, do not use a wood-burning stove, kerosene heater, or fireplace. Minimize exposure to all sources of smoke, including to incense, candles, fires, and fireworks.  Try to stay away from strong odors and sprays, such as perfume, talcum powder, hair spray, and paints.  Decrease humidity in your home and use an indoor air cleaning device. Reduce indoor humidity to below 60%. Dehumidifiers or central air conditioners can do this.  Decrease house dust exposure by changing furnace and air cooler filters frequently.  Try to have someone else vacuum for you once or twice a week. Stay out of rooms while they are being vacuumed and for a short while afterward.  If  you vacuum, use a dust mask from a hardware store, a double-layered or microfilter vacuum cleaner bag, or a vacuum cleaner with a HEPA filter.  Sulfites in foods and beverages can be irritants. Do not drink beer or wine or eat dried fruit, processed potatoes, or shrimp if they cause asthma symptoms.  Cold air can trigger an asthma attack. Cover your nose and mouth with a scarf on cold or windy days.  Several health conditions can make asthma more difficult to manage, including a runny nose, sinus infections, reflux disease, psychological stress, and sleep apnea. Work with your health care provider to manage these conditions.  Avoid close contact with people who have a respiratory infection such as a cold or the flu, since your asthma symptoms may get worse if you catch the infection. Wash your hands thoroughly after touching items that may have  been handled by people with a respiratory infection.  Get a flu shot every year to protect against the flu virus, which often makes asthma worse for days or weeks. Also get a pneumonia shot if you have not previously had one. Unlike the flu shot, the pneumonia shot does not need to be given yearly. Medicines:  Talk to your health care provider about whether it is safe for you to take aspirin or non-steroidal anti-inflammatory medicines (NSAIDs). In a small number of people with asthma, aspirin and NSAIDs can cause asthma attacks. These medicines must be avoided by people who have known aspirin-sensitive asthma. It is important that people with aspirin-sensitive asthma read labels of all over-the-counter medicines used to treat pain, colds, coughs, and fever.  Beta blockers and ACE inhibitors are other medicines you should discuss with your health care provider. HOW CAN I FIND OUT WHAT I AM ALLERGIC TO? Ask your asthma health care provider about allergy skin testing or blood testing (the RAST test) to identify the allergens to which you are sensitive. If you are found to have allergies, the most important thing to do is to try to avoid exposure to any allergens that you are sensitive to as much as possible. Other treatments for allergies, such as medicines and allergy shots (immunotherapy) are available.  CAN I EXERCISE? Follow your health care provider's advice regarding asthma treatment before exercising. It is important to maintain a regular exercise program, but vigorous exercise, or exercise in cold, humid, or dry environments can cause asthma attacks, especially for those people who have exercise-induced asthma. Document Released: 01/17/2009 Document Revised: 10/01/2012 Document Reviewed: 08/06/2012 [facility] Patient Information 2014 [place], [place].

## 2013-06-12 NOTE — ED Notes (Signed)
Pt c/o elevated BP Sx today include SOB and a nosebleed last night w/some dizziness Dr. Denyse Amassorey has seen pt already.

## 2013-06-12 NOTE — ED Provider Notes (Signed)
Katherine [name] is a 48 y.o. female who presents to Urgent Care today for wheezing shortness of breath and elevated blood pressure. The past 2 weeks the patient has noted some worsening wheezing and shortness of breath. She additionally today noted that her blood pressure was elevated. She denies any chest pains or palpitations. She denies any leg swelling or orthopnea. She is not tried any medications. She is no longer taking any blood pressure medications as she developed a cough with one of the blood pressure medications.   Past Medical History  Diagnosis Date  . Asthma   . CHF (congestive heart failure)   . Coronary artery disease     MI 2007  . Asthma   . Bronchitis   . Hypertension    History  Substance Use Topics  . Smoking status: Never Smoker   . Smokeless tobacco: Not on file  . Alcohol Use: Yes     Comment: occasionally   ROS as above Medications: No current facility-administered medications for this encounter.   Current Outpatient Prescriptions  Medication Sig Dispense Refill  . albuterol (PROVENTIL HFA;VENTOLIN HFA) 108 (90 BASE) MCG/ACT inhaler Inhale 2 puffs into the lungs every 6 (six) hours as needed for wheezing.  1 Inhaler  2  . albuterol (PROVENTIL) (5 MG/ML) 0.5% nebulizer solution Take one dose every 6 hours for 2 days and then as needed for shortness of breath  20 mL  1  . amLODipine (NORVASC) 10 MG tablet Take 1 tablet (10 mg total) by mouth daily.  30 tablet  0  . aspirin EC 81 MG tablet Take 1 tablet (81 mg total) by mouth daily.  30 tablet  0  . ibuprofen (ADVIL,MOTRIN) 800 MG tablet Take 1 tablet (800 mg total) by mouth 3 (three) times daily.  21 tablet  0  . predniSONE (DELTASONE) 50 MG tablet Take 1 tablet (50 mg total) by mouth daily.  5 tablet  0  . [DISCONTINUED] fluticasone (FLOVENT HFA) 44 MCG/ACT inhaler Inhale 1 puff into the lungs 2 (two) times daily.  1 Inhaler  12    Exam:  BP 150/92  Pulse 90  Temp(Src) 98.6 F (37 C) (Oral)  Resp 18  SpO2  100%  LMP 11/05/2012 Gen: Well NAD HEENT: EOMI,  MMM no JVD  Lungs: Normal work of breathing. Wheezing and prolonged expiratory phase present bilaterally Heart: RRR no MRG Abd: NABS, Soft. NT, ND Exts: Brisk capillary refill, warm and well perfused. No edema  Patient was given a DuoNeb nebulizer treatment and felt much better   No results found for this or any previous visit (from the past 24 hour(s)). No results found.  Assessment and Plan: 48 y.o. female with asthma exacerbation. Plan to treat with prednisone and albuterol. We'll provide amlodipine for blood pressure control. Emphasize importance of following up with her primary care provider for blood pressure and asthma control.  Discussed warning signs or symptoms. Please see discharge instructions. Patient expresses understanding.    Rodolph BongEvan S Corey, MD 06/12/13 2017

## 2013-07-14 ENCOUNTER — Emergency Department (HOSPITAL_COMMUNITY)
Admission: EM | Admit: 2013-07-14 | Discharge: 2013-07-14 | Discharge status: Against Medical Advice | Payer: Medicaid Other | Attending: Emergency Medicine | Admitting: Emergency Medicine

## 2013-07-14 ENCOUNTER — Emergency Department (HOSPITAL_COMMUNITY): Payer: Medicaid Other

## 2013-07-14 ENCOUNTER — Encounter (HOSPITAL_COMMUNITY): Payer: Self-pay | Admitting: Emergency Medicine

## 2013-07-14 DIAGNOSIS — R109 Unspecified abdominal pain: Secondary | ICD-10-CM | POA: Insufficient documentation

## 2013-07-14 DIAGNOSIS — J45901 Unspecified asthma with (acute) exacerbation: Secondary | ICD-10-CM | POA: Insufficient documentation

## 2013-07-14 DIAGNOSIS — I2510 Atherosclerotic heart disease of native coronary artery without angina pectoris: Secondary | ICD-10-CM | POA: Insufficient documentation

## 2013-07-14 DIAGNOSIS — R079 Chest pain, unspecified: Secondary | ICD-10-CM | POA: Insufficient documentation

## 2013-07-14 DIAGNOSIS — Z79899 Other long term (current) drug therapy: Secondary | ICD-10-CM | POA: Insufficient documentation

## 2013-07-14 DIAGNOSIS — I509 Heart failure, unspecified: Secondary | ICD-10-CM | POA: Insufficient documentation

## 2013-07-14 DIAGNOSIS — I10 Essential (primary) hypertension: Secondary | ICD-10-CM | POA: Insufficient documentation

## 2013-07-14 DIAGNOSIS — Z7982 Long term (current) use of aspirin: Secondary | ICD-10-CM | POA: Insufficient documentation

## 2013-07-14 LAB — COMPREHENSIVE METABOLIC PANEL
ALBUMIN: 4 g/dL (ref 3.5–5.2)
ALK PHOS: 97 U/L (ref 39–117)
ALT: 21 U/L (ref 0–35)
AST: 24 U/L (ref 0–37)
BUN: 15 mg/dL (ref 6–23)
CHLORIDE: 100 meq/L (ref 96–112)
CO2: 28 mEq/L (ref 19–32)
Calcium: 9.6 mg/dL (ref 8.4–10.5)
Creatinine, Ser: 0.85 mg/dL (ref 0.50–1.10)
GFR calc Af Amer: >90 mL/min (ref >90)
GFR calc non Af Amer: 79 mL/min — ABNORMAL LOW (ref >90)
Glucose, Bld: 101 mg/dL — ABNORMAL HIGH (ref 70–99)
POTASSIUM: 3.2 meq/L — AB (ref 3.7–5.3)
Sodium: 140 mEq/L (ref 137–147)
TOTAL PROTEIN: 7.7 g/dL (ref 6.0–8.3)
Total Bilirubin: 0.6 mg/dL (ref 0.3–1.2)

## 2013-07-14 LAB — URINALYSIS, ROUTINE W REFLEX MICROSCOPIC
Bilirubin Urine: NEGATIVE
GLUCOSE, UA: NEGATIVE mg/dL
Ketones, ur: NEGATIVE mg/dL
Leukocytes, UA: NEGATIVE
Nitrite: NEGATIVE
PROTEIN: NEGATIVE mg/dL
SPECIFIC GRAVITY, URINE: 1.024 (ref 1.005–1.030)
Urobilinogen, UA: 0.2 mg/dL (ref 0.0–1.0)
pH: 6 (ref 5.0–8.0)

## 2013-07-14 LAB — CBC WITH DIFFERENTIAL/PLATELET
BASOS ABS: 0 10*3/uL (ref 0.0–0.1)
Basophils Relative: 0 % (ref 0–1)
EOS PCT: 2 % (ref 0–5)
Eosinophils Absolute: 0.1 10*3/uL (ref 0.0–0.7)
HCT: 40.1 % (ref 36.0–46.0)
Hemoglobin: 13.2 g/dL (ref 12.0–15.0)
Lymphocytes Relative: 43 % (ref 12–46)
Lymphs Abs: 2 10*3/uL (ref 0.7–4.0)
MCH: 31 pg (ref 26.0–34.0)
MCHC: 32.9 g/dL (ref 30.0–36.0)
MCV: 94.1 fL (ref 78.0–100.0)
MONO ABS: 0.4 10*3/uL (ref 0.1–1.0)
Monocytes Relative: 9 % (ref 3–12)
Neutro Abs: 2.2 10*3/uL (ref 1.7–7.7)
Neutrophils Relative %: 46 % (ref 43–77)
Platelets: 327 10*3/uL (ref 150–400)
RBC: 4.26 MIL/uL (ref 3.87–5.11)
RDW: 13.8 % (ref 11.5–15.5)
WBC: 4.7 10*3/uL (ref 4.0–10.5)

## 2013-07-14 LAB — I-STAT TROPONIN, ED: Troponin i, poc: 0 ng/mL (ref 0.00–0.08)

## 2013-07-14 LAB — URINE MICROSCOPIC-ADD ON

## 2013-07-14 NOTE — ED Provider Notes (Signed)
CSN: [id number]     Arrival date & time 07/14/13  1923 History   First MD Initiated Contact with Patient 07/14/13 2103     Chief Complaint  Patient presents with  . Chest Pain     (Consider location/radiation/quality/duration/timing/severity/associated sxs/prior Treatment) Patient is a 48 y.o. female presenting with chest pain. The history is provided by the patient.  Chest Pain Pain location:  Substernal area Pain quality: aching   Pain radiates to:  Does not radiate Pain radiates to the back: no   Pain severity:  Moderate Onset quality:  Gradual Duration:  5 days Timing:  Intermittent Progression:  Waxing and waning Chronicity:  New Context: at rest   Relieved by:  Nothing Worsened by:  Nothing tried Ineffective treatments:  None tried Associated symptoms: abdominal pain, lower extremity edema (after walking 5 miles with her friend) and shortness of breath   Associated symptoms: no cough, no fever, no syncope and not vomiting     Past Medical History  Diagnosis Date  . Asthma   . CHF (congestive heart failure)   . Coronary artery disease     MI 2007  . Asthma   . Bronchitis   . Hypertension    Past Surgical History  Procedure Laterality Date  . Abdominal hysterectomy    . Tubal ligation    . Hernia repair    . Tubal ligation      R only   Family History  Problem Relation Age of Onset  . Coronary artery disease     History  Substance Use Topics  . Smoking status: Never Smoker   . Smokeless tobacco: Not on file  . Alcohol Use: Yes     Comment: occasionally   OB History   Grav Para Term Preterm Abortions TAB SAB Ect Mult Living                 Review of Systems  Constitutional: Negative for fever.  Respiratory: Positive for shortness of breath. Negative for cough.   Cardiovascular: Positive for chest pain. Negative for syncope.  Gastrointestinal: Positive for abdominal pain. Negative for vomiting.  All other systems reviewed and are  negative.     Allergies  Review of patient's allergies indicates no known allergies.  Home Medications   Prior to Admission medications   Medication Sig Start Date End Date Taking? Authorizing Provider  albuterol (PROVENTIL HFA;VENTOLIN HFA) 108 (90 BASE) MCG/ACT inhaler Inhale 2 puffs into the lungs every 6 (six) hours as needed for wheezing. 06/12/13  Yes Rodolph BongEvan S Corey, MD  amLODipine (NORVASC) 10 MG tablet Take 1 tablet (10 mg total) by mouth daily. 06/12/13  Yes Rodolph BongEvan S Corey, MD  aspirin EC 81 MG tablet Take 1 tablet (81 mg total) by mouth daily. 12/08/11  Yes Osvaldo ShipperGokul Krishnan, MD   BP 134/91  Pulse 89  Temp(Src) 98.6 F (37 C) (Oral)  Resp 18  SpO2 100%  LMP 11/05/2012 Physical Exam  Constitutional: She is oriented to person, place, and time. She appears well-developed and well-nourished. No distress.  HENT:  Head: Normocephalic.  Eyes: Conjunctivae are normal.  Neck: Neck supple. No tracheal deviation present.  Cardiovascular: Normal rate, regular rhythm and normal heart sounds.   Pulmonary/Chest: Effort normal and breath sounds normal. No respiratory distress. She has no wheezes. She has no rales.  Abdominal: Soft. Bowel sounds are normal. She exhibits no distension.  Neurological: She is alert and oriented to person, place, and time.  Skin: Skin is warm and  dry.  Psychiatric: She has a normal mood and affect.    ED Course  Procedures (including critical care time) Labs Review Labs Reviewed  COMPREHENSIVE METABOLIC PANEL - Abnormal; Notable for the following:    Potassium 3.2 (*)    Glucose, Bld 101 (*)    GFR calc non Af Amer 79 (*)    All other components within normal limits  CBC WITH DIFFERENTIAL  URINALYSIS, ROUTINE W REFLEX MICROSCOPIC  I-STAT TROPOININ, ED    Imaging Review Dg Chest 2 View  07/14/2013   CLINICAL DATA:  Chest pain.  Short of breath and cough.  EXAM: CHEST  2 VIEW  COMPARISON:  12/07/2011  FINDINGS: The heart size and mediastinal contours are  within normal limits. Both lungs are clear. The visualized skeletal structures are unremarkable.  IMPRESSION: No active cardiopulmonary disease.   Electronically Signed   By: Amie Portland M.D.   On: 07/14/2013 20:35     EKG Interpretation None      MDM   Final diagnoses:  Chest pain   48 y.o. female presents with chest pain over the last 5 days, she stated that it started with shortness of breath and wheezing which progressed into her chest pain and cough. She has also had some abdominal discomfort over the same period of time.  Labs unremarkable, chest x-ray without acute abnormality, has history of mild CHF requiring no active treatment, asthma, and bronchitis but is not currently wheezing and has no vital sign abnormalities. The patient eloped from the department after hearing her lab results unannounced prior to being evaluated by the attending physician and therefore left AGAINST MEDICAL ADVICE.  Lyndal Pulley, MD 07/15/13 0023  Toy Luce, MD 07/15/13 [phone]

## 2013-07-14 NOTE — ED Notes (Addendum)
Pt states SOB, nausea, gas, vomiting, flatulence, belching, headache, diaphoresis for several days. Pt reports urinary frequency. States she has been using her inhalers and BP meds. Pain comes and goes and when it comes it "feels like someone is punching her in chest and back".

## 2013-07-14 NOTE — ED Notes (Signed)
Patient transported to X-ray 

## 2013-07-14 NOTE — ED Notes (Signed)
No answer for triage.

## 2013-07-14 NOTE — ED Notes (Signed)
Came to room pt had vacated the room; Md notified of AMA

## 2013-10-25 ENCOUNTER — Emergency Department (HOSPITAL_BASED_OUTPATIENT_CLINIC_OR_DEPARTMENT_OTHER)
Admission: EM | Admit: 2013-10-25 | Discharge: 2013-10-25 | Disposition: A | Discharge status: Home / Self Care | Payer: Medicaid Other | Attending: Emergency Medicine | Admitting: Emergency Medicine

## 2013-10-25 ENCOUNTER — Encounter (HOSPITAL_BASED_OUTPATIENT_CLINIC_OR_DEPARTMENT_OTHER): Payer: Self-pay | Admitting: Emergency Medicine

## 2013-10-25 DIAGNOSIS — F4321 Adjustment disorder with depressed mood: Secondary | ICD-10-CM | POA: Diagnosis not present

## 2013-10-25 DIAGNOSIS — J45909 Unspecified asthma, uncomplicated: Secondary | ICD-10-CM | POA: Insufficient documentation

## 2013-10-25 DIAGNOSIS — Z79899 Other long term (current) drug therapy: Secondary | ICD-10-CM | POA: Insufficient documentation

## 2013-10-25 DIAGNOSIS — I2510 Atherosclerotic heart disease of native coronary artery without angina pectoris: Secondary | ICD-10-CM | POA: Insufficient documentation

## 2013-10-25 DIAGNOSIS — I10 Essential (primary) hypertension: Secondary | ICD-10-CM | POA: Insufficient documentation

## 2013-10-25 DIAGNOSIS — F411 Generalized anxiety disorder: Secondary | ICD-10-CM | POA: Diagnosis not present

## 2013-10-25 DIAGNOSIS — Z7982 Long term (current) use of aspirin: Secondary | ICD-10-CM | POA: Diagnosis not present

## 2013-10-25 DIAGNOSIS — M25519 Pain in unspecified shoulder: Secondary | ICD-10-CM | POA: Diagnosis not present

## 2013-10-25 DIAGNOSIS — F419 Anxiety disorder, unspecified: Secondary | ICD-10-CM

## 2013-10-25 DIAGNOSIS — M542 Cervicalgia: Secondary | ICD-10-CM | POA: Insufficient documentation

## 2013-10-25 DIAGNOSIS — I509 Heart failure, unspecified: Secondary | ICD-10-CM | POA: Diagnosis not present

## 2013-10-25 MED ORDER — DIAZEPAM 5 MG PO TABS
5.0000 mg | ORAL_TABLET | Freq: Once | ORAL | Status: AC
  Administered 2013-10-25: 5 mg via ORAL
  Filled 2013-10-25: qty 1

## 2013-10-25 MED ORDER — DIAZEPAM 5 MG PO TABS: 5.0000 mg | ORAL_TABLET | Freq: Every evening | ORAL | Status: DC | PRN

## 2013-10-25 MED ORDER — METHOCARBAMOL 500 MG PO TABS: 500.0000 mg | ORAL_TABLET | Freq: Two times a day (BID) | ORAL | Status: DC

## 2013-10-25 NOTE — Discharge Instructions (Signed)
Grief after a loss is normal.   Use the Valium at night as needed for insomnia or anxiety.  Use Robaxin for the neck and shoulder muscle pain.

## 2013-10-25 NOTE — ED Provider Notes (Signed)
CSN: [id number]     Arrival date & time 10/25/13  1843 History   First MD Initiated Contact with Patient 10/25/13 1959     Chief Complaint  Patient presents with  . Anxiety      HPI  Pt presents with "anxiety".  Pt states that her Father died 2 days ago.  Has "fits" where she just cant stop crying. Cant sleep.  Right posterior neck and shoulder pain. No weakness or radicular symptoms.  Past Medical History  Diagnosis Date  . Asthma   . CHF (congestive heart failure)   . Coronary artery disease     MI 2007  . Asthma   . Bronchitis   . Hypertension    Past Surgical History  Procedure Laterality Date  . Abdominal hysterectomy    . Tubal ligation    . Hernia repair    . Tubal ligation      R only   Family History  Problem Relation Age of Onset  . Coronary artery disease     History  Substance Use Topics  . Smoking status: Never Smoker   . Smokeless tobacco: Not on file  . Alcohol Use: Yes     Comment: occasionally   OB History   Grav Para Term Preterm Abortions TAB SAB Ect Mult Living                 Review of Systems  Constitutional: Negative for fever, chills, diaphoresis, appetite change and fatigue.  HENT: Negative for mouth sores, sore throat and trouble swallowing.   Eyes: Negative for visual disturbance.  Respiratory: Negative for cough, chest tightness, shortness of breath and wheezing.   Cardiovascular: Negative for chest pain.  Gastrointestinal: Negative for nausea, vomiting, abdominal pain, diarrhea and abdominal distention.  Endocrine: Negative for polydipsia, polyphagia and polyuria.  Genitourinary: Negative for dysuria, frequency and hematuria.  Musculoskeletal: Negative for gait problem.       Right neck and shoulder pain  Skin: Negative for color change, pallor and rash.  Neurological: Negative for dizziness, syncope, light-headedness and headaches.  Hematological: Does not bruise/bleed easily.  Psychiatric/Behavioral: Negative for behavioral  problems and confusion. The patient is nervous/anxious.        Insomnia      Allergies  Review of patient's allergies indicates no known allergies.  Home Medications   Prior to Admission medications   Medication Sig Start Date End Date Taking? Authorizing Provider  albuterol (PROVENTIL HFA;VENTOLIN HFA) 108 (90 BASE) MCG/ACT inhaler Inhale 2 puffs into the lungs every 6 (six) hours as needed for wheezing. 06/12/13   Rodolph Bong, MD  amLODipine (NORVASC) 10 MG tablet Take 1 tablet (10 mg total) by mouth daily. 06/12/13   Rodolph Bong, MD  aspirin EC 81 MG tablet Take 1 tablet (81 mg total) by mouth daily. 12/08/11   Osvaldo Shipper, MD  diazepam (VALIUM) 5 MG tablet Take 1 tablet (5 mg total) by mouth at bedtime as needed for anxiety (take at night for anxiety or insomnia). 10/25/13   Rolland Porter, MD  methocarbamol (ROBAXIN) 500 MG tablet Take 1 tablet (500 mg total) by mouth 2 (two) times daily. 10/25/13   Rolland Porter, MD   BP 161/104  Pulse 117  Temp(Src) 98.2 F (36.8 C) (Oral)  Resp 20  Ht  (1.676 m)  Wt 175 lb (79.379 kg)  BMI 28.26 kg/m2  SpO2 97%  LMP 11/05/2012 Physical Exam  Constitutional: She is oriented to person, place, and time. She  appears well-developed and well-nourished. No distress.  HENT:  Head: Normocephalic.  Eyes: Conjunctivae are normal. Pupils are equal, round, and reactive to light. No scleral icterus.  Neck: Normal range of motion. Neck supple. No thyromegaly present.  Cardiovascular: Normal rate and regular rhythm.  Exam reveals no gallop and no friction rub.   No murmur heard. Pulmonary/Chest: Effort normal and breath sounds normal. No respiratory distress. She has no wheezes. She has no rales.  Abdominal: Soft. Bowel sounds are normal. She exhibits no distension. There is no tenderness. There is no rebound.  Musculoskeletal: Normal range of motion.       Back:  Neurological: She is alert and oriented to person, place, and time.  Skin: Skin is warm  and dry. No rash noted.  Psychiatric: She has a normal mood and affect. Her behavior is normal.    ED Course  Procedures (including critical care time) Labs Review Labs Reviewed - No data to display  Imaging Review No results found.   EKG Interpretation None      MDM   Final diagnoses:  Grief  Anxiety    We had a short discussion about grief.  I discussed with her that she'll have to go to a grieving sometime.  If not now, it  will find her later. Offered her a number of (4)Valium to take only at night as needed for insomnia. Otherwise encouraged her to go through the grieving process with her friends and family.    Rolland Porter, MD 10/25/13 2024

## 2013-10-25 NOTE — ED Notes (Signed)
Patient states that her father just passed away this past weekend after long term illness and she is having "crying spells", states that her right side is also very sore.

## 2013-12-28 ENCOUNTER — Emergency Department (HOSPITAL_COMMUNITY)
Admission: EM | Admit: 2013-12-28 | Discharge: 2013-12-29 | Disposition: A | Discharge status: Home / Self Care | Payer: Medicaid Other | Attending: Emergency Medicine | Admitting: Emergency Medicine

## 2013-12-28 ENCOUNTER — Emergency Department (HOSPITAL_COMMUNITY): Payer: Medicaid Other

## 2013-12-28 ENCOUNTER — Encounter (HOSPITAL_COMMUNITY): Payer: Self-pay | Admitting: Emergency Medicine

## 2013-12-28 DIAGNOSIS — Y998 Other external cause status: Secondary | ICD-10-CM | POA: Diagnosis not present

## 2013-12-28 DIAGNOSIS — I10 Essential (primary) hypertension: Secondary | ICD-10-CM | POA: Diagnosis not present

## 2013-12-28 DIAGNOSIS — Y9259 Other trade areas as the place of occurrence of the external cause: Secondary | ICD-10-CM | POA: Diagnosis not present

## 2013-12-28 DIAGNOSIS — S3992XA Unspecified injury of lower back, initial encounter: Secondary | ICD-10-CM | POA: Diagnosis present

## 2013-12-28 DIAGNOSIS — J45909 Unspecified asthma, uncomplicated: Secondary | ICD-10-CM | POA: Diagnosis not present

## 2013-12-28 DIAGNOSIS — Z79899 Other long term (current) drug therapy: Secondary | ICD-10-CM | POA: Insufficient documentation

## 2013-12-28 DIAGNOSIS — T148XXA Other injury of unspecified body region, initial encounter: Secondary | ICD-10-CM

## 2013-12-28 DIAGNOSIS — I509 Heart failure, unspecified: Secondary | ICD-10-CM | POA: Diagnosis not present

## 2013-12-28 DIAGNOSIS — S161XXA Strain of muscle, fascia and tendon at neck level, initial encounter: Secondary | ICD-10-CM | POA: Insufficient documentation

## 2013-12-28 DIAGNOSIS — Z7982 Long term (current) use of aspirin: Secondary | ICD-10-CM | POA: Insufficient documentation

## 2013-12-28 DIAGNOSIS — S39012A Strain of muscle, fascia and tendon of lower back, initial encounter: Secondary | ICD-10-CM | POA: Insufficient documentation

## 2013-12-28 DIAGNOSIS — W208XXA Other cause of strike by thrown, projected or falling object, initial encounter: Secondary | ICD-10-CM | POA: Diagnosis not present

## 2013-12-28 DIAGNOSIS — Y9389 Activity, other specified: Secondary | ICD-10-CM | POA: Insufficient documentation

## 2013-12-28 DIAGNOSIS — I2510 Atherosclerotic heart disease of native coronary artery without angina pectoris: Secondary | ICD-10-CM | POA: Diagnosis not present

## 2013-12-28 DIAGNOSIS — M542 Cervicalgia: Secondary | ICD-10-CM

## 2013-12-28 MED ORDER — IBUPROFEN 400 MG PO TABS
600.0000 mg | ORAL_TABLET | Freq: Once | ORAL | Status: AC
  Administered 2013-12-28: 600 mg via ORAL
  Filled 2013-12-28 (×2): qty 1

## 2013-12-28 MED ORDER — CYCLOBENZAPRINE HCL 10 MG PO TABS: 10.0000 mg | ORAL_TABLET | Freq: Two times a day (BID) | ORAL | Status: DC | PRN

## 2013-12-28 MED ORDER — IBUPROFEN 600 MG PO TABS: 600.0000 mg | ORAL_TABLET | Freq: Four times a day (QID) | ORAL | Status: DC | PRN

## 2013-12-28 NOTE — Discharge Instructions (Signed)

## 2013-12-28 NOTE — ED Provider Notes (Signed)
CSN: [id number]     Arrival date & time 12/28/13  2134 History   First [id number] Initiated Contact with Patient 12/28/13 2147     Chief Complaint  Patient presents with  . Back Pain     (Consider location/radiation/quality/duration/timing/severity/associated sxs/prior Treatment) HPI Comments: Patient presents to emergency department with chief complaint of back pain. She states that she was shopping this evening, and while she was pulling some sodas off-the-shelf, the crate of sodas slid off and fell around her falling onto the ground and spraining her with soda. She denies falling. She states that some of the soda did hit her in the back. She complains of neck pain and mild back pain. She has not tried taking anything to alleviate her symptoms. She did not fall. She states that she was afraid to move. Pain is mild.  The history is provided by the patient. No language interpreter was used.    Past Medical History  Diagnosis Date  . Asthma   . CHF (congestive heart failure)   . Coronary artery disease     MI 2007  . Asthma   . Bronchitis   . Hypertension    Past Surgical History  Procedure Laterality Date  . Abdominal hysterectomy    . Tubal ligation    . Hernia repair    . Tubal ligation      R only   Family History  Problem Relation Age of Onset  . Coronary artery disease     History  Substance Use Topics  . Smoking status: Never Smoker   . Smokeless tobacco: Not on file  . Alcohol Use: Yes     Comment: occasionally   OB History    No data available     Review of Systems  Constitutional: Negative for fever and chills.  Respiratory: Negative for shortness of breath.   Cardiovascular: Negative for chest pain.  Gastrointestinal: Negative for nausea, vomiting, diarrhea and constipation.  Genitourinary: Negative for dysuria.  Musculoskeletal: Positive for neck pain.  All other systems reviewed and are negative.     Allergies  Review of patient's allergies indicates  no known allergies.  Home Medications   Prior to Admission medications   Medication Sig Start Date End Date Taking? Authorizing Provider  albuterol (PROVENTIL HFA;VENTOLIN HFA) 108 (90 BASE) MCG/ACT inhaler Inhale 2 puffs into the lungs every 6 (six) hours as needed for wheezing. 06/12/13   Rodolph BongEvan S Corey, MD  amLODipine (NORVASC) 10 MG tablet Take 1 tablet (10 mg total) by mouth daily. 06/12/13   Rodolph BongEvan S Corey, MD  aspirin EC 81 MG tablet Take 1 tablet (81 mg total) by mouth daily. 12/08/11   Osvaldo ShipperGokul Krishnan, MD  diazepam (VALIUM) 5 MG tablet Take 1 tablet (5 mg total) by mouth at bedtime as needed for anxiety (take at night for anxiety or insomnia). 10/25/13   Rolland PorterMark James, MD  methocarbamol (ROBAXIN) 500 MG tablet Take 1 tablet (500 mg total) by mouth 2 (two) times daily. 10/25/13   Rolland PorterMark James, MD   BP 150/94 mmHg  Pulse 95  Temp(Src) 97.8 F (36.6 C) (Oral)  Resp 16  SpO2 98%  LMP 11/05/2012 Physical Exam  Constitutional: She is oriented to person, place, and time. She appears well-developed and well-nourished. No distress.  HENT:  Head: Normocephalic and atraumatic.  No scalp hematoma, no battle sign  Eyes: Conjunctivae and EOM are normal. Right eye exhibits no discharge. Left eye exhibits no discharge. No scleral icterus.  Neck: Normal range  of motion. Neck supple. No tracheal deviation present.  Cardiovascular: Normal rate, regular rhythm and normal heart sounds.  Exam reveals no gallop and no friction rub.   No murmur heard. Pulmonary/Chest: Effort normal and breath sounds normal. No respiratory distress. She has no wheezes.  Abdominal: Soft. She exhibits no distension. There is no tenderness.  Musculoskeletal: Normal range of motion.  Cervical paraspinal muscles tender to palpation, no bony tenderness, step-offs, or gross abnormality or deformity of spine, patient is able to ambulate, moves all extremities  Bilateral great toe extension intact Bilateral plantar/dorsiflexion intact   Neurological: She is alert and oriented to person, place, and time. She has normal reflexes.  Sensation and strength intact bilaterally Symmetrical reflexes  Skin: Skin is warm. She is not diaphoretic.  Psychiatric: She has a normal mood and affect. Her behavior is normal. Judgment and thought content normal.  Nursing note and vitals reviewed.   ED Course  Procedures (including critical care time) Labs Review Labs Reviewed - No data to display  Imaging Review Dg Cervical Spine Complete  12/28/2013   CLINICAL DATA:  Neck pain, uncertain of any injury  EXAM: CERVICAL SPINE  4+ VIEWS  COMPARISON:  None.  FINDINGS: Seven cervical segments are well visualized. Degenerative changes are noted at C4-5, C5-6 and C6-7. The odontoid is within normal limits. No acute facet abnormality is noted. No acute fracture is seen.  IMPRESSION: Degenerative changes without acute abnormality.   Electronically Signed   By: Alcide CleverMark  Lukens M.D.   On: 12/28/2013 22:50     EKG Interpretation None      MDM   Final diagnoses:  Neck pain  Muscle strain    Patient with neck pain after [facility] She complains of pain in her neck and pain in her back. She did not fall to the ground. She states that some of the sodas hit her as they fell off the shelf. They did not knock her to the ground, but she states that they fell around her and started spraying on her. She states that she got scared, and didn't move. EMS was called, and the patient was placed in a c-collar and on a spine board. Patient was cleared from the spine board in triage. I cleared the patient c-collar, but will get a cervical spine x-ray to reinforce that there ia no cervical fracture. Anticipate the patient can be discharged home. She is well-appearing, and not in any apparent distress.  11:14 PM Patient requesting CT.  She did not even hit her head.  Clears [place] CT rules. There is no indication for  imaging.  No head injury.  No LOC.  No falls or head injury.  This is not an appropriate use of ED resources.  Will discharge to home with PCP follow-up.    11:21 PM After explaining all of the indications for head CT and "pan-scanning" in detail to the patient, she is agreeable with discharge plan.  Will give muscle relaxer and anti-inflammatory.    This patient is exhibiting characteristics of secondary gain.    Roxy Horsemanobert Carry Weesner, PA-C 12/28/13 2329  Enid SkeensJoshua M Zavitz, MD 12/29/13 [phone]

## 2013-12-28 NOTE — ED Notes (Signed)
The tech ambulated patient to the restroom. The RN is aware.

## 2013-12-28 NOTE — ED Notes (Signed)
PA Browning at bedside. 

## 2013-12-28 NOTE — ED Notes (Addendum)
Pt brought in by [facility]. [facility], when "a crate of sodas fell about 426ft" onto pt's back. Pt c/o of 8/10 "lumbar pain radiating down RT leg and stiff neck." Pt denies falling/hitting head or LOC. Pt has hx of CHF and asthma. Pt placed in c-collar, headblocks, and LSB by PTAR. Spine cleared by RN Ashura in triage . Pt still in c-collar. NAD noted. Pt poor historian, story scattered and unclear.

## 2013-12-29 ENCOUNTER — Emergency Department (HOSPITAL_BASED_OUTPATIENT_CLINIC_OR_DEPARTMENT_OTHER): Payer: Medicaid Other

## 2013-12-29 ENCOUNTER — Encounter (HOSPITAL_BASED_OUTPATIENT_CLINIC_OR_DEPARTMENT_OTHER): Payer: Self-pay

## 2013-12-29 ENCOUNTER — Emergency Department (HOSPITAL_BASED_OUTPATIENT_CLINIC_OR_DEPARTMENT_OTHER)
Admission: EM | Admit: 2013-12-29 | Discharge: 2013-12-29 | Disposition: A | Discharge status: Home / Self Care | Payer: Medicaid Other | Attending: Emergency Medicine | Admitting: Emergency Medicine

## 2013-12-29 DIAGNOSIS — Z79899 Other long term (current) drug therapy: Secondary | ICD-10-CM | POA: Diagnosis not present

## 2013-12-29 DIAGNOSIS — S39012A Strain of muscle, fascia and tendon of lower back, initial encounter: Secondary | ICD-10-CM

## 2013-12-29 DIAGNOSIS — I509 Heart failure, unspecified: Secondary | ICD-10-CM | POA: Insufficient documentation

## 2013-12-29 DIAGNOSIS — Y9389 Activity, other specified: Secondary | ICD-10-CM | POA: Insufficient documentation

## 2013-12-29 DIAGNOSIS — R52 Pain, unspecified: Secondary | ICD-10-CM

## 2013-12-29 DIAGNOSIS — W228XXA Striking against or struck by other objects, initial encounter: Secondary | ICD-10-CM | POA: Insufficient documentation

## 2013-12-29 DIAGNOSIS — J45901 Unspecified asthma with (acute) exacerbation: Secondary | ICD-10-CM | POA: Insufficient documentation

## 2013-12-29 DIAGNOSIS — S99921A Unspecified injury of right foot, initial encounter: Secondary | ICD-10-CM | POA: Diagnosis not present

## 2013-12-29 DIAGNOSIS — Y998 Other external cause status: Secondary | ICD-10-CM | POA: Diagnosis not present

## 2013-12-29 DIAGNOSIS — I10 Essential (primary) hypertension: Secondary | ICD-10-CM | POA: Insufficient documentation

## 2013-12-29 DIAGNOSIS — Z7982 Long term (current) use of aspirin: Secondary | ICD-10-CM | POA: Diagnosis not present

## 2013-12-29 DIAGNOSIS — I2510 Atherosclerotic heart disease of native coronary artery without angina pectoris: Secondary | ICD-10-CM | POA: Insufficient documentation

## 2013-12-29 DIAGNOSIS — S3992XA Unspecified injury of lower back, initial encounter: Secondary | ICD-10-CM | POA: Diagnosis present

## 2013-12-29 DIAGNOSIS — Y92513 Shop (commercial) as the place of occurrence of the external cause: Secondary | ICD-10-CM | POA: Diagnosis not present

## 2013-12-29 MED ORDER — ACETAMINOPHEN 500 MG PO TABS
1000.0000 mg | ORAL_TABLET | Freq: Once | ORAL | Status: AC
  Administered 2013-12-29: 1000 mg via ORAL
  Filled 2013-12-29: qty 2

## 2013-12-29 MED ORDER — HYDROCODONE-ACETAMINOPHEN 5-325 MG PO TABS: 1.0000 | ORAL_TABLET | Freq: Four times a day (QID) | ORAL | Status: DC | PRN
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
## 2013-12-29 NOTE — Discharge Instructions (Signed)
X-rays of the back and both feet and review of x-rays of the neck from yesterday all negative. No bony injuries. This would be soft tissue or muscular type injuries. Take the pain medicine as needed. Take the anti-inflammatory medicine and muscle relaxers you were provided with yesterday. Follow-up with the wellness clinic. Return for any new or worse symptoms.

## 2014-04-02 ENCOUNTER — Emergency Department (HOSPITAL_BASED_OUTPATIENT_CLINIC_OR_DEPARTMENT_OTHER)
Admission: EM | Admit: 2014-04-02 | Discharge: 2014-04-02 | Disposition: A | Discharge status: Home / Self Care | Payer: Medicaid Other | Attending: Emergency Medicine | Admitting: Emergency Medicine

## 2014-04-02 ENCOUNTER — Emergency Department (HOSPITAL_BASED_OUTPATIENT_CLINIC_OR_DEPARTMENT_OTHER): Payer: Medicaid Other

## 2014-04-02 ENCOUNTER — Encounter (HOSPITAL_BASED_OUTPATIENT_CLINIC_OR_DEPARTMENT_OTHER): Payer: Self-pay | Admitting: Emergency Medicine

## 2014-04-02 DIAGNOSIS — I10 Essential (primary) hypertension: Secondary | ICD-10-CM | POA: Diagnosis not present

## 2014-04-02 DIAGNOSIS — W208XXA Other cause of strike by thrown, projected or falling object, initial encounter: Secondary | ICD-10-CM | POA: Insufficient documentation

## 2014-04-02 DIAGNOSIS — R059 Cough, unspecified: Secondary | ICD-10-CM

## 2014-04-02 DIAGNOSIS — I2510 Atherosclerotic heart disease of native coronary artery without angina pectoris: Secondary | ICD-10-CM | POA: Insufficient documentation

## 2014-04-02 DIAGNOSIS — Z7982 Long term (current) use of aspirin: Secondary | ICD-10-CM | POA: Insufficient documentation

## 2014-04-02 DIAGNOSIS — I509 Heart failure, unspecified: Secondary | ICD-10-CM | POA: Insufficient documentation

## 2014-04-02 DIAGNOSIS — Y9289 Other specified places as the place of occurrence of the external cause: Secondary | ICD-10-CM | POA: Diagnosis not present

## 2014-04-02 DIAGNOSIS — J45909 Unspecified asthma, uncomplicated: Secondary | ICD-10-CM | POA: Insufficient documentation

## 2014-04-02 DIAGNOSIS — J40 Bronchitis, not specified as acute or chronic: Secondary | ICD-10-CM

## 2014-04-02 DIAGNOSIS — Z79899 Other long term (current) drug therapy: Secondary | ICD-10-CM | POA: Insufficient documentation

## 2014-04-02 DIAGNOSIS — R000 Tachycardia, unspecified: Secondary | ICD-10-CM | POA: Diagnosis not present

## 2014-04-02 DIAGNOSIS — Y9389 Activity, other specified: Secondary | ICD-10-CM | POA: Diagnosis not present

## 2014-04-02 DIAGNOSIS — R05 Cough: Secondary | ICD-10-CM | POA: Diagnosis present

## 2014-04-02 DIAGNOSIS — S4991XA Unspecified injury of right shoulder and upper arm, initial encounter: Secondary | ICD-10-CM | POA: Diagnosis not present

## 2014-04-02 DIAGNOSIS — Y998 Other external cause status: Secondary | ICD-10-CM | POA: Insufficient documentation

## 2014-04-02 MED ORDER — ACETAMINOPHEN 325 MG PO TABS
650.0000 mg | ORAL_TABLET | Freq: Once | ORAL | Status: AC
  Administered 2014-04-02: 650 mg via ORAL

## 2014-04-02 MED ORDER — ALBUTEROL SULFATE HFA 108 (90 BASE) MCG/ACT IN AERS
2.0000 | INHALATION_SPRAY | RESPIRATORY_TRACT | Status: DC
  Administered 2014-04-02: 2 via RESPIRATORY_TRACT
  Filled 2014-04-02: qty 6.7

## 2014-04-02 MED ORDER — ACETAMINOPHEN 325 MG PO TABS
ORAL_TABLET | ORAL | Status: AC
  Filled 2014-04-02: qty 2

## 2014-04-02 MED ORDER — PREDNISONE 10 MG PO TABS: 20.0000 mg | ORAL_TABLET | Freq: Every day | ORAL | Status: DC

## 2014-04-02 NOTE — Discharge Instructions (Signed)
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
## 2014-04-02 NOTE — ED Provider Notes (Signed)
CSN: [id number]     Arrival date & time 04/02/14  [id number] History   First MD Initiated Contact with Patient 04/02/14 [id number]     Chief Complaint  Patient presents with  . Shoulder Injury  . Cough     (Consider location/radiation/quality/duration/timing/severity/associated sxs/prior Treatment) HPI Comments: In here complaining of cough or congestion and URI symptoms. Also complains of musculoskeletal right shoulder pain worse with positions. Cough has been nonproductive and she does smoke cigarettes. No fever or chills. She's had some rhinorrhea as well as a scratchy throat. Symptoms persistent and not responsive to over-the-counter medications. Denies any anginal or CHF symptoms  Patient is a 49 y.o. female presenting with shoulder injury and cough. The history is provided by the patient.  Shoulder Injury  Cough   Past Medical History  Diagnosis Date  . Asthma   . CHF (congestive heart failure)   . Coronary artery disease     MI 2007  . Asthma   . Bronchitis   . Hypertension    Past Surgical History  Procedure Laterality Date  . Abdominal hysterectomy    . Tubal ligation    . Hernia repair    . Tubal ligation      R only   Family History  Problem Relation Age of Onset  . Coronary artery disease     History  Substance Use Topics  . Smoking status: Never Smoker   . Smokeless tobacco: Not on file  . Alcohol Use: Yes     Comment: occasionally   OB History    No data available     Review of Systems  Respiratory: Positive for cough.   All other systems reviewed and are negative.     Allergies  Review of patient's allergies indicates no known allergies.  Home Medications   Prior to Admission medications   Medication Sig Start Date End Date Taking? Authorizing Provider  albuterol (PROVENTIL HFA;VENTOLIN HFA) 108 (90 BASE) MCG/ACT inhaler Inhale 2 puffs into the lungs every 6 (six) hours as needed for wheezing. 06/12/13   Rodolph BongEvan S Corey, MD  amLODipine (NORVASC) 10 MG  tablet Take 1 tablet (10 mg total) by mouth daily. 06/12/13   Rodolph BongEvan S Corey, MD  aspirin EC 81 MG tablet Take 1 tablet (81 mg total) by mouth daily. 12/08/11   Osvaldo ShipperGokul Krishnan, MD  cyclobenzaprine (FLEXERIL) 10 MG tablet Take 1 tablet (10 mg total) by mouth 2 (two) times daily as needed for muscle spasms. 12/28/13   Roxy Horsemanobert Browning, PA-C  diazepam (VALIUM) 5 MG tablet Take 1 tablet (5 mg total) by mouth at bedtime as needed for anxiety (take at night for anxiety or insomnia). 10/25/13   Rolland PorterMark James, MD  HYDROcodone-acetaminophen (NORCO/VICODIN) 5-325 MG per tablet Take 1-2 tablets by mouth every 6 (six) hours as needed for moderate pain. 12/29/13   Vanetta MuldersScott Zackowski, MD  ibuprofen (ADVIL,MOTRIN) 600 MG tablet Take 1 tablet (600 mg total) by mouth every 6 (six) hours as needed. 12/28/13   Roxy Horsemanobert Browning, PA-C  methocarbamol (ROBAXIN) 500 MG tablet Take 1 tablet (500 mg total) by mouth 2 (two) times daily. 10/25/13   Rolland PorterMark James, MD   BP 138/84 mmHg  Pulse 116  Temp(Src) 98.8 F (37.1 C) (Oral)  Resp 18  Ht 5\' 6"  (1.676 m)  Wt 172 lb (78.019 kg)  BMI 27.77 kg/m2  SpO2 100%  LMP 11/05/2012 Physical Exam  Constitutional: She is oriented to person, place, and time. She appears well-developed and well-nourished.  Non-toxic  appearance. No distress.  HENT:  Head: Normocephalic and atraumatic.  Eyes: Conjunctivae, EOM and lids are normal. Pupils are equal, round, and reactive to light.  Neck: Normal range of motion. Neck supple. No tracheal deviation present. No thyroid mass present.  Cardiovascular: Regular rhythm and normal heart sounds.  Tachycardia present.  Exam reveals no gallop.   No murmur heard. Pulmonary/Chest: Effort normal and breath sounds normal. No stridor. No respiratory distress. She has no decreased breath sounds. She has no wheezes. She has no rhonchi. She has no rales.  Abdominal: Soft. Normal appearance and bowel sounds are normal. She exhibits no distension. There is no tenderness.  There is no rebound and no CVA tenderness.  Musculoskeletal: Normal range of motion. She exhibits no edema or tenderness.       Arms: Neurological: She is alert and oriented to person, place, and time. She has normal strength. No cranial nerve deficit or sensory deficit. GCS eye subscore is 4. GCS verbal subscore is 5. GCS motor subscore is 6.  Skin: Skin is warm and dry. No abrasion and no rash noted.  Psychiatric: She has a normal mood and affect. Her speech is normal and behavior is normal.  Nursing note and vitals reviewed.   ED Course  Procedures (including critical care time) Labs Review Labs Reviewed - No data to display  Imaging Review No results found.   EKG Interpretation None      MDM   Final diagnoses:  Cough    Patient with musculoskeletal shoulder pain. Will check chest x-ray to rule out pneumonia  11:35 AM X-ray negative for pneumonia. Will treat  for bronchitis  Toy Martis, MD 04/02/14 1136

## 2014-05-14 ENCOUNTER — Emergency Department (HOSPITAL_BASED_OUTPATIENT_CLINIC_OR_DEPARTMENT_OTHER)
Admission: EM | Admit: 2014-05-14 | Discharge: 2014-05-14 | Disposition: A | Discharge status: Home / Self Care | Payer: Medicaid Other | Attending: Emergency Medicine | Admitting: Emergency Medicine

## 2014-05-14 ENCOUNTER — Emergency Department (HOSPITAL_BASED_OUTPATIENT_CLINIC_OR_DEPARTMENT_OTHER): Payer: Medicaid Other

## 2014-05-14 ENCOUNTER — Encounter (HOSPITAL_BASED_OUTPATIENT_CLINIC_OR_DEPARTMENT_OTHER): Payer: Self-pay | Admitting: *Deleted

## 2014-05-14 DIAGNOSIS — Z90710 Acquired absence of both cervix and uterus: Secondary | ICD-10-CM | POA: Diagnosis not present

## 2014-05-14 DIAGNOSIS — Z7952 Long term (current) use of systemic steroids: Secondary | ICD-10-CM | POA: Insufficient documentation

## 2014-05-14 DIAGNOSIS — Z9889 Other specified postprocedural states: Secondary | ICD-10-CM | POA: Insufficient documentation

## 2014-05-14 DIAGNOSIS — R109 Unspecified abdominal pain: Secondary | ICD-10-CM

## 2014-05-14 DIAGNOSIS — Z9851 Tubal ligation status: Secondary | ICD-10-CM | POA: Diagnosis not present

## 2014-05-14 DIAGNOSIS — R079 Chest pain, unspecified: Secondary | ICD-10-CM | POA: Diagnosis not present

## 2014-05-14 DIAGNOSIS — Z7982 Long term (current) use of aspirin: Secondary | ICD-10-CM | POA: Diagnosis not present

## 2014-05-14 DIAGNOSIS — R1030 Lower abdominal pain, unspecified: Secondary | ICD-10-CM | POA: Diagnosis present

## 2014-05-14 DIAGNOSIS — R143 Flatulence: Secondary | ICD-10-CM | POA: Insufficient documentation

## 2014-05-14 DIAGNOSIS — I509 Heart failure, unspecified: Secondary | ICD-10-CM | POA: Diagnosis not present

## 2014-05-14 DIAGNOSIS — I2510 Atherosclerotic heart disease of native coronary artery without angina pectoris: Secondary | ICD-10-CM | POA: Insufficient documentation

## 2014-05-14 DIAGNOSIS — J45909 Unspecified asthma, uncomplicated: Secondary | ICD-10-CM | POA: Insufficient documentation

## 2014-05-14 DIAGNOSIS — I10 Essential (primary) hypertension: Secondary | ICD-10-CM | POA: Insufficient documentation

## 2014-05-14 DIAGNOSIS — Z720 Tobacco use: Secondary | ICD-10-CM | POA: Diagnosis not present

## 2014-05-14 DIAGNOSIS — Z79899 Other long term (current) drug therapy: Secondary | ICD-10-CM | POA: Diagnosis not present

## 2014-05-14 DIAGNOSIS — N898 Other specified noninflammatory disorders of vagina: Secondary | ICD-10-CM | POA: Insufficient documentation

## 2014-05-14 LAB — WET PREP, GENITAL
Clue Cells Wet Prep HPF POC: NONE SEEN
Trich, Wet Prep: NONE SEEN
Yeast Wet Prep HPF POC: NONE SEEN

## 2014-05-14 LAB — URINALYSIS, ROUTINE W REFLEX MICROSCOPIC
BILIRUBIN URINE: NEGATIVE
Glucose, UA: NEGATIVE mg/dL
KETONES UR: NEGATIVE mg/dL
Nitrite: NEGATIVE
PH: 6 (ref 5.0–8.0)
Protein, ur: NEGATIVE mg/dL
Specific Gravity, Urine: 1.003 — ABNORMAL LOW (ref 1.005–1.030)
Urobilinogen, UA: 0.2 mg/dL (ref 0.0–1.0)

## 2014-05-14 LAB — COMPREHENSIVE METABOLIC PANEL
ALT: 26 U/L (ref 0–35)
AST: 28 U/L (ref 0–37)
Albumin: 4.2 g/dL (ref 3.5–5.2)
Alkaline Phosphatase: 69 U/L (ref 39–117)
Anion gap: 8 (ref 5–15)
BUN: 11 mg/dL (ref 6–23)
CALCIUM: 9.3 mg/dL (ref 8.4–10.5)
CO2: 27 mmol/L (ref 19–32)
CREATININE: 0.61 mg/dL (ref 0.50–1.10)
Chloride: 108 mmol/L (ref 96–112)
GFR calc Af Amer: >90 mL/min (ref >90)
Glucose, Bld: 95 mg/dL (ref 70–99)
POTASSIUM: 3.3 mmol/L — AB (ref 3.5–5.1)
SODIUM: 143 mmol/L (ref 135–145)
Total Bilirubin: <0.1 mg/dL — ABNORMAL LOW (ref 0.3–1.2)
Total Protein: 7.4 g/dL (ref 6.0–8.3)

## 2014-05-14 LAB — CBC WITH DIFFERENTIAL/PLATELET
BASOS ABS: 0 10*3/uL (ref 0.0–0.1)
BASOS PCT: 0 % (ref 0–1)
EOS ABS: 0.1 10*3/uL (ref 0.0–0.7)
Eosinophils Relative: 3 % (ref 0–5)
HEMATOCRIT: 40 % (ref 36.0–46.0)
Hemoglobin: 13.4 g/dL (ref 12.0–15.0)
Lymphocytes Relative: 60 % — ABNORMAL HIGH (ref 12–46)
Lymphs Abs: 2.3 10*3/uL (ref 0.7–4.0)
MCH: 31.4 pg (ref 26.0–34.0)
MCHC: 33.5 g/dL (ref 30.0–36.0)
MCV: 93.7 fL (ref 78.0–100.0)
Monocytes Absolute: 0.2 10*3/uL (ref 0.1–1.0)
Monocytes Relative: 6 % (ref 3–12)
NEUTROS ABS: 1.1 10*3/uL — AB (ref 1.7–7.7)
NEUTROS PCT: 31 % — AB (ref 43–77)
Platelets: DECREASED 10*3/uL (ref 150–400)
RBC: 4.27 MIL/uL (ref 3.87–5.11)
RDW: 13.2 % (ref 11.5–15.5)
WBC: 3.7 10*3/uL — ABNORMAL LOW (ref 4.0–10.5)

## 2014-05-14 LAB — TROPONIN I: Troponin I: <0.03 ng/mL (ref <0.031)

## 2014-05-14 LAB — URINE MICROSCOPIC-ADD ON

## 2014-05-14 NOTE — Discharge Instructions (Signed)
Return to the ED with any concerns including difficulty breathing, worsening abdominal pain especially if it localizes to the right lower abdomen, vomiting and not able to keep down liquids, decreased level of alertness/lethargy, or any other alarming symptoms

## 2014-05-14 NOTE — ED Provider Notes (Signed)
CSN: [id number]     Arrival date & time 05/14/14  2001 History   First MD Initiated Contact with Patient 05/14/14 2039     Chief Complaint  Patient presents with  . Abdominal Pain     (Consider location/radiation/quality/duration/timing/severity/associated sxs/prior Treatment) Patient is a 49 y.o. female presenting with abdominal pain. The history is provided by the patient. No language interpreter was used.  Abdominal Pain Pain location:  Suprapubic Pain quality: aching   Pain radiates to:  Does not radiate Duration:  10 days Timing:  Constant Progression:  Worsening Chronicity:  New Context: recent sexual activity   Relieved by:  Nothing Worsened by:  Nothing tried Ineffective treatments:  None tried Associated symptoms: flatus and vaginal discharge   Risk factors: no alcohol abuse   Pt reports urine smells like bad fish, vaginal discharge with odor. Gas smells like an outhouse and bowel movements smell like [employer].   Pt also reports having chest pain.  Pt thinks chest pain is from not eating but wants to get checked due to previous heart issues.  Pt reports boyfriend is a Naval architect and she wants std check  Past Medical History  Diagnosis Date  . Asthma   . CHF (congestive heart failure)   . Coronary artery disease     MI 2007  . Asthma   . Bronchitis   . Hypertension    Past Surgical History  Procedure Laterality Date  . Abdominal hysterectomy    . Tubal ligation    . Hernia repair    . Tubal ligation      R only   Family History  Problem Relation Age of Onset  . Coronary artery disease     History  Substance Use Topics  . Smoking status: Current Every Day Smoker    Types: Cigarettes  . Smokeless tobacco: Not on file  . Alcohol Use: Yes     Comment: occasionally   OB History    No data available     Review of Systems  Gastrointestinal: Positive for abdominal pain and flatus.  Genitourinary: Positive for vaginal discharge.  All other systems  reviewed and are negative.     Allergies  Review of patient's allergies indicates no known allergies.  Home Medications   Prior to Admission medications   Medication Sig Start Date End Date Taking? Authorizing Provider  albuterol (PROVENTIL HFA;VENTOLIN HFA) 108 (90 BASE) MCG/ACT inhaler Inhale 2 puffs into the lungs every 6 (six) hours as needed for wheezing. 06/12/13   Rodolph Bong, MD  amLODipine (NORVASC) 10 MG tablet Take 1 tablet (10 mg total) by mouth daily. 06/12/13   Rodolph Bong, MD  aspirin EC 81 MG tablet Take 1 tablet (81 mg total) by mouth daily. 12/08/11   Osvaldo Shipper, MD  cyclobenzaprine (FLEXERIL) 10 MG tablet Take 1 tablet (10 mg total) by mouth 2 (two) times daily as needed for muscle spasms. 12/28/13   Roxy Horseman, PA-C  diazepam (VALIUM) 5 MG tablet Take 1 tablet (5 mg total) by mouth at bedtime as needed for anxiety (take at night for anxiety or insomnia). 10/25/13   Rolland Porter, MD  HYDROcodone-acetaminophen (NORCO/VICODIN) 5-325 MG per tablet Take 1-2 tablets by mouth every 6 (six) hours as needed for moderate pain. 12/29/13   Vanetta Mulders, MD  ibuprofen (ADVIL,MOTRIN) 600 MG tablet Take 1 tablet (600 mg total) by mouth every 6 (six) hours as needed. 12/28/13   Roxy Horseman, PA-C  methocarbamol (ROBAXIN) 500 MG  tablet Take 1 tablet (500 mg total) by mouth 2 (two) times daily. 10/25/13   Rolland PorterMark James, MD  predniSONE (DELTASONE) 10 MG tablet Take 2 tablets (20 mg total) by mouth daily. 04/02/14   Lorre NickAnthony Allen, MD   BP 156/96 mmHg  Pulse 106  Temp(Src) 98.1 F (36.7 C) (Oral)  Resp 18  Wt 172 lb (78.019 kg)  SpO2 99%  LMP 11/05/2012 Physical Exam  Constitutional: She appears well-developed and well-nourished.  HENT:  Head: Normocephalic and atraumatic.  Eyes: Conjunctivae are normal. Pupils are equal, round, and reactive to light.  Neck: Normal range of motion. Neck supple.  Cardiovascular: Normal rate and normal heart sounds.   Pulmonary/Chest: Effort  normal.  Abdominal: Soft. There is no tenderness.  Genitourinary: Vaginal discharge found.  Vaginal discharge,  Thick white,  Adnexa no masses,  Cervix nontender  Musculoskeletal: Normal range of motion.  Skin: Skin is warm.    ED Course  Procedures (including critical care time) Labs Review Labs Reviewed  URINALYSIS, ROUTINE W REFLEX MICROSCOPIC - Abnormal; Notable for the following:    Specific Gravity, Urine 1.003 (*)    Hgb urine dipstick SMALL (*)    Leukocytes, UA TRACE (*)    All other components within normal limits  URINE MICROSCOPIC-ADD ON    Imaging Review No results found.   EKG Interpretation   Date/Time:  Friday May 14 2014 21:11:16 EDT Ventricular Rate:  98 PR Interval:  176 QRS Duration: 96 QT Interval:  392 QTC Calculation: 500 R Axis:   36 Text Interpretation:  Normal sinus rhythm Prolonged QT Abnormal ECG No  significant change since last tracing Confirmed by Karma GanjaLINKER  MD, MARTHA  [phone]([id number]) on 05/14/2014 9:58:49 PM      MDM   Final diagnoses:  Chest pain  Abdominal pain, unspecified abdominal location  Vaginal discharge        Elson AreasLeslie K Shakir Petrosino, PA-C 05/16/14 1215  Jerelyn ScottMartha Linker, MD 05/16/14 [id number]

## 2014-05-14 NOTE — ED Notes (Signed)
Patient requesting pain medication-EDP made aware. 

## 2014-05-14 NOTE — ED Notes (Signed)
Patient now complaining of chest pain.  Reports this has been intermittent for the past "few days".  Denies nausea and vomiting.  Reports she has not been eating much due to abdominal pain.

## 2014-05-14 NOTE — ED Notes (Signed)
Pt c/o lower abd pain and vaginal odor x10 days. Pt sts she believes there is vaginal d/c and urination discomfort.

## 2014-05-14 NOTE — ED Notes (Signed)
EDP at the bedside.  ?

## 2014-05-14 NOTE — ED Notes (Signed)
Patient given discharge instructions and reviewed.  Asked patient if she had any other questions about paperwork.  Patient states "No, [facility]  Explained to patient that was her right but that I could not give her pain medication because it was not ordered by the EDP.

## 2014-05-17 LAB — URINE CULTURE

## 2014-05-17 LAB — GC/CHLAMYDIA PROBE AMP (~~LOC~~) NOT AT ARMC
Chlamydia: NEGATIVE
Neisseria Gonorrhea: NEGATIVE

## 2014-05-18 ENCOUNTER — Telehealth (HOSPITAL_BASED_OUTPATIENT_CLINIC_OR_DEPARTMENT_OTHER): Payer: Self-pay | Admitting: Emergency Medicine

## 2014-05-18 NOTE — Telephone Encounter (Signed)
Post ED Visit - Positive Culture Follow-up  Culture report reviewed by antimicrobial stewardship pharmacist: []  Wes Dulaney, Pharm.D., BCPS [x]  Celedonio MiyamotoJeremy Frens, Pharm.D., BCPS []  Georgina PillionElizabeth Martin, Pharm.D., BCPS []  [place]h Pham, [place]Pharm.D., BCPS, AAHIVP []  Estella HuskMichelle Turner, Pharm.D., BCPS, AAHIVP []  Elder CyphersLorie Poole, [address]Pharm[address]D., BCPS  Positive urine E. Coli Treated with none, asymotomatic and no further patient follow-up is required at this time.  Berle MullMiller, Violet Seabury 05/18/2014, 12:06 PM

## 2014-06-28 ENCOUNTER — Encounter (HOSPITAL_COMMUNITY): Payer: Self-pay | Admitting: Nurse Practitioner

## 2014-06-28 ENCOUNTER — Emergency Department (HOSPITAL_COMMUNITY)
Admission: EM | Admit: 2014-06-28 | Discharge: 2014-06-28 | Disposition: A | Discharge status: Home / Self Care | Payer: Medicaid Other | Attending: Emergency Medicine | Admitting: Emergency Medicine

## 2014-06-28 DIAGNOSIS — F4320 Adjustment disorder, unspecified: Secondary | ICD-10-CM | POA: Insufficient documentation

## 2014-06-28 DIAGNOSIS — I252 Old myocardial infarction: Secondary | ICD-10-CM | POA: Diagnosis not present

## 2014-06-28 DIAGNOSIS — I509 Heart failure, unspecified: Secondary | ICD-10-CM | POA: Diagnosis not present

## 2014-06-28 DIAGNOSIS — I10 Essential (primary) hypertension: Secondary | ICD-10-CM | POA: Insufficient documentation

## 2014-06-28 DIAGNOSIS — Z720 Tobacco use: Secondary | ICD-10-CM | POA: Diagnosis not present

## 2014-06-28 DIAGNOSIS — Z7952 Long term (current) use of systemic steroids: Secondary | ICD-10-CM | POA: Diagnosis not present

## 2014-06-28 DIAGNOSIS — G479 Sleep disorder, unspecified: Secondary | ICD-10-CM | POA: Insufficient documentation

## 2014-06-28 DIAGNOSIS — I2510 Atherosclerotic heart disease of native coronary artery without angina pectoris: Secondary | ICD-10-CM | POA: Diagnosis not present

## 2014-06-28 DIAGNOSIS — Z3202 Encounter for pregnancy test, result negative: Secondary | ICD-10-CM | POA: Diagnosis not present

## 2014-06-28 DIAGNOSIS — F419 Anxiety disorder, unspecified: Secondary | ICD-10-CM | POA: Diagnosis present

## 2014-06-28 DIAGNOSIS — J45909 Unspecified asthma, uncomplicated: Secondary | ICD-10-CM | POA: Insufficient documentation

## 2014-06-28 DIAGNOSIS — Z7982 Long term (current) use of aspirin: Secondary | ICD-10-CM | POA: Insufficient documentation

## 2014-06-28 DIAGNOSIS — Z79899 Other long term (current) drug therapy: Secondary | ICD-10-CM | POA: Insufficient documentation

## 2014-06-28 DIAGNOSIS — F4321 Adjustment disorder with depressed mood: Secondary | ICD-10-CM

## 2014-06-28 LAB — URINALYSIS, ROUTINE W REFLEX MICROSCOPIC
Bilirubin Urine: NEGATIVE
GLUCOSE, UA: NEGATIVE mg/dL
Ketones, ur: NEGATIVE mg/dL
Leukocytes, UA: NEGATIVE
Nitrite: NEGATIVE
PH: 5.5 (ref 5.0–8.0)
Protein, ur: NEGATIVE mg/dL
Specific Gravity, Urine: 1.012 (ref 1.005–1.030)
Urobilinogen, UA: 0.2 mg/dL (ref 0.0–1.0)

## 2014-06-28 LAB — URINE MICROSCOPIC-ADD ON

## 2014-06-28 LAB — POC URINE PREG, ED: Preg Test, Ur: NEGATIVE

## 2014-06-28 MED ORDER — LORAZEPAM 0.5 MG PO TABS
1.0000 mg | ORAL_TABLET | Freq: Once | ORAL | Status: AC
  Administered 2014-06-28: 1 mg via ORAL
  Filled 2014-06-28: qty 2

## 2014-06-28 MED ORDER — LORAZEPAM 1 MG PO TABS: 1.0000 mg | ORAL_TABLET | Freq: Three times a day (TID) | ORAL | Status: DC | PRN
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
## 2014-06-28 NOTE — Discharge Instructions (Signed)
Please follow up with your primary care physician in 1-2 days. If you do not have one please call the [facility] and wellness Center number listed above. Please read all discharge instructions and return precautions.  Generalized Anxiety Disorder Generalized anxiety disorder (GAD) is a mental disorder. It interferes with life functions, including relationships, work, and school. GAD is different from normal anxiety, which everyone experiences at some point in their lives in response to specific life events and activities. Normal anxiety actually helps [place] prepare for and get through these life events and activities. Normal anxiety goes away after the event or activity is over.  GAD causes anxiety that is not necessarily related to specific events or activities. It also causes excess anxiety in proportion to specific events or activities. The anxiety associated with GAD is also difficult to control. GAD can vary from mild to severe. People with severe GAD can have intense waves of anxiety with physical symptoms (panic attacks).  SYMPTOMS The anxiety and worry associated with GAD are difficult to control. This anxiety and worry are related to many life events and activities and also occur more days than not for 6 months or longer. People with GAD also have three or more of the following symptoms (one or more in children): 1. Restlessness.  2. Fatigue. 3. Difficulty concentrating.  4. Irritability. 5. Muscle tension. 6. Difficulty sleeping or unsatisfying sleep. DIAGNOSIS GAD is diagnosed through an assessment by your health care provider. Your health care provider will ask you questions aboutyour mood,physical symptoms, and events in your life. Your health care provider may ask you about your medical history and use of alcohol or drugs, including prescription medicines. Your health care provider may also do a physical exam and blood tests. Certain medical conditions and the use of certain substances  can cause symptoms similar to those associated with GAD. Your health care provider may refer you to a mental health specialist for further evaluation. TREATMENT The following therapies are usually used to treat GAD:   Medication. Antidepressant medication usually is prescribed for long-term daily control. Antianxiety medicines may be added in severe cases, especially when panic attacks occur.   Talk therapy (psychotherapy). Certain types of talk therapy can be helpful in treating GAD by providing support, education, and guidance. A form of talk therapy called cognitive behavioral therapy can teach you healthy ways to think about and react to daily life events and activities.  Stress managementtechniques. These include yoga, meditation, and exercise and can be very helpful when they are practiced regularly. A mental health specialist can help determine which treatment is best for you. Some people see improvement with one therapy. However, other people require a combination of therapies. Document Released: 05/26/2012 Document Revised: 06/15/2013 Document Reviewed: 05/26/2012 [facility] Patient Information 2015 [place], [place]. This information is not intended to replace advice given to you by your health care provider. Make sure you discuss any questions you have with your health care provider.  Depression Depression refers to feeling sad, low, down in the dumps, blue, gloomy, or empty. In general, there are two kinds of depression: 7. Normal sadness or normal grief. This kind of depression is one that we all feel from time to time after upsetting life experiences, such as the loss of a job or the ending of a relationship. This kind of depression is considered normal, is short lived, and resolves within a few days to 2 weeks. Depression experienced after the loss of a loved one (bereavement) often lasts longer  than 2 weeks but normally gets better with time. 8. Clinical depression. This kind of  depression lasts longer than normal sadness or normal grief or interferes with your ability to function at home, at work, and in school. It also interferes with your personal relationships. It affects almost every aspect of your life. Clinical depression is an illness. Symptoms of depression can also be caused by conditions other than those mentioned above, such as:  Physical illness. Some physical illnesses, including underactive thyroid gland (hypothyroidism), severe anemia, specific types of cancer, diabetes, uncontrolled seizures, heart and lung problems, strokes, and chronic pain are commonly associated with symptoms of depression.  Side effects of some prescription medicine. In some people, certain types of medicine can cause symptoms of depression.  Substance abuse. Abuse of alcohol and illicit drugs can cause symptoms of depression. SYMPTOMS Symptoms of normal sadness and normal grief include the following:  Feeling sad or crying for short periods of time.  Not caring about anything (apathy).  Difficulty sleeping or sleeping too much.  No longer able to enjoy the things you used to enjoy.  Desire to be by oneself all the time (social isolation).  Lack of energy or motivation.  Difficulty concentrating or remembering.  Change in appetite or weight.  Restlessness or agitation. Symptoms of clinical depression include the same symptoms of normal sadness or normal grief and also the following symptoms:  Feeling sad or crying all the time.  Feelings of guilt or worthlessness.  Feelings of hopelessness or helplessness.  Thoughts of suicide or the desire to harm yourself (suicidal ideation).  Loss of touch with reality (psychotic symptoms). Seeing or hearing things that are not real (hallucinations) or having false beliefs about your life or the people around you (delusions and paranoia). DIAGNOSIS  The diagnosis of clinical depression is usually based on how bad the symptoms  are and how long they have lasted. Your health care provider will also ask you questions about your medical history and substance use to find out if physical illness, use of prescription medicine, or substance abuse is causing your depression. Your health care provider may also order blood tests. TREATMENT  Often, normal sadness and normal grief do not require treatment. However, sometimes antidepressant medicine is given for bereavement to ease the depressive symptoms until they resolve. The treatment for clinical depression depends on how bad the symptoms are but often includes antidepressant medicine, counseling with a mental health professional, or both. Your health care provider will help to determine what treatment is best for you. Depression caused by physical illness usually goes away with appropriate medical treatment of the illness. If prescription medicine is causing depression, talk with your health care provider about stopping the medicine, decreasing the dose, or changing to another medicine. Depression caused by the abuse of alcohol or illicit drugs goes away when you stop using these substances. Some adults need professional help in order to stop drinking or using drugs. SEEK IMMEDIATE MEDICAL CARE IF:  You have thoughts about hurting yourself or others.  You lose touch with reality (have psychotic symptoms).  You are taking medicine for depression and have a serious side effect. FOR MORE INFORMATION  National Alliance on Mental Illness: www.nami.[employer]org  National Institute of Mental Health: http://www.maynard.net/www.nimh.nih.gov Document Released: 01/27/2000 Document Revised: 06/15/2013 Document Reviewed: 04/30/2011 [facility] Patient Information 2015 [place], [place]. This information is not intended to replace advice given to you by your health care provider. Make sure you discuss any questions you have with your  health care provider. If you do not have a primary care doctor to follow up with regarding  today's visit, please call the [facility] at [phone] to make an appointment. Hours of operation are 10am - 7pm, Monday through Friday, and they have a sliding scale fee.     RESOURCE GUIDE  Emergency Shelter:  [facility] [phone]   Intensive Outpatient Programs: [facility]      [address] [place], [place] [phone] Both a day and evening program       [facility]     [address]        [place], [place] [address] [phone]         ADS: [facility] [address] [place] [phone]  [facility] ACCESS LINE: [phone] or [phone] [address] [place], [place] [address] EntrepreneurLoan.co.[email or web address].[email or web address]   Substance Abuse Resources: - [facility]  [phone] - [facility] [phone] - [facility] ([phone] Floydene Flock[facility] [phone] - Residential & Outpatient Substance Abuse Program  [phone]  Psychological Services: Tressie Ellis- [facility]  [phone] [facility]  [phone] - [facility], [phone] [place] [address], [place], ACCESS LINE: [phone] or [phone], EntrepreneurLoan.co.[email or web address]/adult.htm  Mobile Crisis Teams:                                        [facility]         Mobile Crisis Care Unit [phone]             [facility] [address] [place] [phone]                                         Interventionist [address]on De[address]sch [address] [place] [phone]  Self-Help/Support Groups: [facility] [place] Variety of support groups [phone] (call for more info)  Narcotics Anonymous (NA) [facility] [address] [place] - 2 meetings at this location  Residential Treatment Programs:    [facility]      [address]        [place]       [phone]         [facility] [address], [place] [address] [place], [place]  [id number] [phone]  [facility]  [address] [place], [place] [address] [phone] Admissions: 8am-3pm M-F  [facility]     [address]        [place], [place] [address]       [phone]         [facility] [address] [place], [place] [phone]  [facility] [address] [place], [place] [phone]  [facility] - Intensive Outpatient      [address]     [place], [place]       [phone]         [facility]     [address] [place], [place] [phone] or [phone]  [facility]idential Treatment Services (RTS), Medicaid [address] [place], [place] [phone]  [facility]  [address] [place] [phone]  [facility] Resources: [place]- [phone]               General Therapy                                                Angie Fava, PhD        [address] [place], [place] [address]         [phone]   Insurance  [facility]   [address] [place], [place] [address] [phone]  [facility] [address]  [place], [place] [address] [phone] Insurance/Medicaid/sponsorship through [facility]                                              [address]                                        [place], [place] [address]    Therapy/tele-psych/case         [phone]          [facility] [address] [place], [place]  [address]  Adolescent/group home/case management [phone]                                           Creola Corn PhD       General  therapy       Insurance   [phone]         Dr. Lolly Mustache, Insurance, M-F [phone]
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
## 2014-10-29 ENCOUNTER — Encounter (HOSPITAL_COMMUNITY): Payer: Self-pay | Admitting: Emergency Medicine

## 2014-10-29 ENCOUNTER — Emergency Department (INDEPENDENT_AMBULATORY_CARE_PROVIDER_SITE_OTHER)
Admission: EM | Admit: 2014-10-29 | Discharge: 2014-10-29 | Disposition: A | Discharge status: Home / Self Care | Payer: Medicaid Other | Source: Home / Self Care | Attending: Family Medicine | Admitting: Family Medicine

## 2014-10-29 ENCOUNTER — Other Ambulatory Visit (HOSPITAL_COMMUNITY)
Admission: RE | Admit: 2014-10-29 | Discharge: 2014-10-29 | Disposition: A | Discharge status: Home / Self Care | Payer: Medicaid Other | Source: Ambulatory Visit | Attending: Family Medicine | Admitting: Family Medicine

## 2014-10-29 DIAGNOSIS — L292 Pruritus vulvae: Secondary | ICD-10-CM | POA: Diagnosis not present

## 2014-10-29 DIAGNOSIS — R312 Other microscopic hematuria: Secondary | ICD-10-CM

## 2014-10-29 DIAGNOSIS — R3129 Other microscopic hematuria: Secondary | ICD-10-CM

## 2014-10-29 DIAGNOSIS — N760 Acute vaginitis: Secondary | ICD-10-CM | POA: Insufficient documentation

## 2014-10-29 DIAGNOSIS — Z113 Encounter for screening for infections with a predominantly sexual mode of transmission: Secondary | ICD-10-CM | POA: Insufficient documentation

## 2014-10-29 DIAGNOSIS — R350 Frequency of micturition: Secondary | ICD-10-CM

## 2014-10-29 LAB — POCT URINALYSIS DIP (DEVICE)
BILIRUBIN URINE: NEGATIVE
Glucose, UA: NEGATIVE mg/dL
KETONES UR: NEGATIVE mg/dL
Leukocytes, UA: NEGATIVE
Nitrite: NEGATIVE
PH: 7 (ref 5.0–8.0)
Protein, ur: NEGATIVE mg/dL
SPECIFIC GRAVITY, URINE: 1.015 (ref 1.005–1.030)
Urobilinogen, UA: 0.2 mg/dL (ref 0.0–1.0)

## 2014-10-29 MED ORDER — CEPHALEXIN 500 MG PO CAPS: 500.0000 mg | ORAL_CAPSULE | Freq: Four times a day (QID) | ORAL | Status: DC

## 2014-10-29 MED ORDER — FLUCONAZOLE 150 MG PO TABS: ORAL_TABLET | ORAL | Status: DC

## 2014-10-29 NOTE — ED Notes (Signed)
Patient concerned for perineal itchiness.  Patient has had some vaginal bleeding.  Patient admits to using nair products to remove hair in bikini area

## 2014-10-29 NOTE — Discharge Instructions (Signed)
Hematuria °Hematuria is blood in your urine. It can be caused by a bladder infection, kidney infection, prostate infection, kidney stone, or cancer of your urinary tract. Infections can usually be treated with medicine, and a kidney stone usually will pass through your urine. If neither of these is the cause of your hematuria, further workup to find out the reason may be needed. °It is very important that you tell your health care provider about any blood you see in your urine, even if the blood stops without treatment or happens without causing pain. Blood in your urine that happens and then stops and then happens again can be a symptom of a very serious condition. Also, pain is not a symptom in the initial stages of many urinary cancers. °HOME CARE INSTRUCTIONS  °· Drink lots of fluid, 3-4 quarts a day. If you have been diagnosed with an infection, cranberry juice is especially recommended, in addition to large amounts of water. °· Avoid caffeine, tea, and carbonated beverages because they tend to irritate the bladder. °· Avoid alcohol because it may irritate the prostate. °· Take all medicines as directed by your health care provider. °· If you were prescribed an antibiotic medicine, finish it all even if you start to feel better. °· If you have been diagnosed with a kidney stone, follow your health care provider's instructions regarding straining your urine to catch the stone. °· Empty your bladder often. Avoid holding urine for long periods of time. °· After a bowel movement, women should cleanse front to back. Use each tissue only once. °· Empty your bladder before and after sexual intercourse if you are a female. °SEEK MEDICAL CARE IF: °· You develop back pain. °· You have a fever. °· You have a feeling of sickness in your stomach (nausea) or vomiting. °· Your symptoms are not better in 3 days. Return sooner if you are getting worse. °SEEK IMMEDIATE MEDICAL CARE IF:  °· You develop severe vomiting and are  unable to keep the medicine down. °· You develop severe back or abdominal pain despite taking your medicines. °· You begin passing a large amount of blood or clots in your urine. °· You feel extremely weak or faint, or you pass out. °MAKE SURE YOU:  °· Understand these instructions. °· Will watch your condition. °· Will get help right away if you are not doing well or get worse. °Document Released: 01/29/2005 Document Revised: 06/15/2013 Document Reviewed: 09/29/2012 °ExitCare® Patient Information ©2015 [employer]. This information is not intended to replace advice given to you by your health care provider. Make sure you discuss any questions you have with your health care provider. ° °Urinary Frequency °The number of times a normal person urinates depends upon how much liquid they take in and how much liquid they are losing. If the temperature is hot and there is high humidity, then the person will sweat more and usually breathe a little more frequently. These factors decrease the amount of frequency of urination that would be considered normal. °The amount you drink is easily determined, but the amount of fluid lost is sometimes more difficult to calculate.  °Fluid is lost in two ways: °· Sensible fluid loss is usually measured by the amount of urine that you get rid of. Losses of fluid can also occur with diarrhea. °· Insensible fluid loss is more difficult to measure. It is caused by evaporation. Insensible loss of fluid occurs through breathing and sweating. It usually ranges from a little less than   a quart to a little more than a quart of fluid a day. °In normal temperatures and activity levels, the average person may urinate 4 to 7 times in a 24-hour period. Needing to urinate more often than that could indicate a problem. If one urinates 4 to 7 times in 24 hours and has large volumes each time, that could indicate a different problem from one who urinates 4 to 7 times a day and has small volumes. The time  of urinating is also important. Most urinating should be done during the waking hours. Getting up at night to urinate frequently can indicate some problems. °CAUSES  °The bladder is the organ in your lower abdomen that holds urine. Like a balloon, it swells some as it fills up. Your nerves sense this and tell you it is time to head for the bathroom. There are a number of reasons that you might feel the need to urinate more often than usual. They include: °· Urinary tract infection. This is usually associated with other signs such as burning when you urinate. °· In men, problems with the prostate (a walnut-size gland that is located near the tube that carries urine out of your body). There are two reasons why the prostate can cause an increased frequency of urination: °¨ An enlarged prostate that does not let the bladder empty well. If the bladder only half empties when you urinate, then it only has half the capacity to fill before you have to urinate again. °¨ The nerves in the bladder become more hypersensitive with an increased size of the prostate even if the bladder empties completely. °· Pregnancy. °· Obesity. Excess weight is more likely to cause a problem for women than for men. °· Bladder stones or other bladder problems. °· Caffeine. °· Alcohol. °· Medications. For example, drugs that help the body get rid of extra fluid (diuretics) increase urine production. Some other medicines must be taken with lots of fluids. °· Muscle or nerve weakness. This might be the result of a spinal cord injury, a stroke, multiple sclerosis, or Parkinson disease. °· Long-standing diabetes can decrease the sensation of the bladder. This loss of sensation makes it harder to sense the bladder needs to be emptied. Over a period of years, the bladder is stretched out by constant overfilling. This weakens the bladder muscles so that the bladder does not empty well and has less capacity to fill with new urine. °· Interstitial cystitis  (also called painful bladder syndrome). This condition develops because the tissues that line the inside of the bladder are inflamed (inflammation is the body's way of reacting to injury or infection). It causes pain and frequent urination. It occurs in women more often than in men. °DIAGNOSIS  °· To decide what might be causing your urinary frequency, your health care provider will probably: °¨ Ask about symptoms you have noticed. °¨ Ask about your overall health. This will include questions about any medications you are taking. °¨ Do a physical examination. °· Order some tests. These might include: °¨ A blood test to check for diabetes or other health issues that could be contributing to the problem. °¨ Urine testing. This could measure the flow of urine and the pressure on the bladder. °¨ A test of your neurological system (the brain, spinal cord, and nerves). This is the system that senses the need to urinate. °¨ A bladder test to check whether it is emptying completely when you urinate. °¨ Cystoscopy. This test uses a thin tube   with a tiny camera on it. It offers a look inside your urethra and bladder to see if there are problems. °¨ Imaging tests. You might be given a contrast dye and then asked to urinate. X-rays are taken to see how your bladder is working. °TREATMENT  °It is important for you to be evaluated to determine if the amount or frequency that you have is unusual or abnormal. If it is found to be abnormal, the cause should be determined and this can usually be found out easily. Depending upon the cause, treatment could include medication, stimulation of the nerves, or surgery. °There are not too many things that you can do as an individual to change your urinary frequency. It is important that you balance the amount of fluid intake needed to compensate for your activity and the temperature. Medical problems will be diagnosed and taken care of by your physician. There is no particular bladder  training such as Kegel exercises that you can do to help urinary frequency. This is an exercise that is usually recommended for people who have leaking of urine when they laugh, cough, or sneeze. °HOME CARE INSTRUCTIONS  °· Take any medications your health care provider prescribed or suggested. Follow the directions carefully. °· Practice any lifestyle changes that are recommended. These might include: °¨ Drinking less fluid or drinking at different times of the day. If you need to urinate often during the night, for example, you may need to stop drinking fluids early in the evening. °¨ Cutting down on caffeine or alcohol. They both can make you need to urinate more often than normal. Caffeine is found in coffee, tea, and sodas. °¨ Losing weight, if that is recommended. °· Keep a journal or a log. You might be asked to record how much you drink and when and where you feel the need to urinate. This will also help evaluate how well the treatment provided by your physician is working. °SEEK MEDICAL CARE IF:  °· Your need to urinate often gets worse. °· You feel increased pain or irritation when you urinate. °· You notice blood in your urine. °· You have questions about any medications that your health care provider recommended. °· You notice blood, pus, or swelling at the site of any test or treatment procedure. °· You develop a fever of more than 100.5°F (38.1°C). °SEEK IMMEDIATE MEDICAL CARE IF:  °You develop a fever of more than 102.0°F (38.9°C). °Document Released: 11/25/2008 Document Revised: 06/15/2013 Document Reviewed: 11/25/2008 °ExitCare® Patient Information ©2015 [employer]. This information is not intended to replace advice given to you by your health care provider. Make sure you discuss any questions you have with your health care provider. ° °

## 2014-10-29 NOTE — ED Provider Notes (Signed)
CSN: [id number]     Arrival date & time 10/29/14  1511 History   First MD Initiated Contact with Patient 10/29/14 1648     Chief Complaint  Patient presents with  . Vaginal Itching   (Consider location/radiation/quality/duration/timing/severity/associated sxs/prior Treatment) HPI Comments: 49 year old female complaining of a one-week history of vaginal burning for which she has been applying a small tube of an unknown type of OTC cream as well as calamine lotion. There is a small amount of vaginal discharge. She is complaining of gross hematuria and urinary frequency.   Past Medical History  Diagnosis Date  . Asthma   . CHF (congestive heart failure)   . Coronary artery disease     MI 2007  . Asthma   . Bronchitis   . Hypertension    Past Surgical History  Procedure Laterality Date  . Abdominal hysterectomy    . Tubal ligation    . Hernia repair    . Tubal ligation      R only   Family History  Problem Relation Age of Onset  . Coronary artery disease     Social History  Substance Use Topics  . Smoking status: Current Every Day Smoker    Types: Cigarettes  . Smokeless tobacco: None  . Alcohol Use: Yes     Comment: occasionally   OB History    No data available     Review of Systems  Constitutional: Negative.   HENT: Negative.   Respiratory: Negative.   Gastrointestinal: Negative.   Genitourinary: Positive for frequency, hematuria, vaginal discharge and vaginal pain. Negative for dysuria.  Musculoskeletal: Negative.   All other systems reviewed and are negative.   Allergies  Review of patient's allergies indicates no known allergies.  Home Medications   Prior to Admission medications   Medication Sig Start Date End Date Taking? Authorizing Provider  albuterol (PROVENTIL HFA;VENTOLIN HFA) 108 (90 BASE) MCG/ACT inhaler Inhale 2 puffs into the lungs every 6 (six) hours as needed for wheezing. 06/12/13   Rodolph Bong, MD  amLODipine (NORVASC) 10 MG tablet Take 1  tablet (10 mg total) by mouth daily. 06/12/13   Rodolph Bong, MD  aspirin EC 81 MG tablet Take 1 tablet (81 mg total) by mouth daily. 12/08/11   Osvaldo Shipper, MD  cephALEXin (KEFLEX) 500 MG capsule Take 1 capsule (500 mg total) by mouth 4 (four) times daily. 10/29/14   Hayden Rasmussen, NP  cyclobenzaprine (FLEXERIL) 10 MG tablet Take 1 tablet (10 mg total) by mouth 2 (two) times daily as needed for muscle spasms. 12/28/13   Roxy Horseman, PA-C  diazepam (VALIUM) 5 MG tablet Take 1 tablet (5 mg total) by mouth at bedtime as needed for anxiety (take at night for anxiety or insomnia). 10/25/13   Rolland Porter, MD  fluconazole (DIFLUCAN) 150 MG tablet 1 tab po x 1. May repeat in 72 hours if no improvement 10/29/14   Hayden Rasmussen, NP  HYDROcodone-acetaminophen (NORCO/VICODIN) 5-325 MG per tablet Take 1-2 tablets by mouth every 6 (six) hours as needed for moderate pain. 12/29/13   Vanetta Mulders, MD  ibuprofen (ADVIL,MOTRIN) 600 MG tablet Take 1 tablet (600 mg total) by mouth every 6 (six) hours as needed. 12/28/13   Roxy Horseman, PA-C  LORazepam (ATIVAN) 1 MG tablet Take 1 tablet (1 mg total) by mouth 3 (three) times daily as needed for anxiety. 06/28/14   Jennifer Piepenbrink, PA-C  methocarbamol (ROBAXIN) 500 MG tablet Take 1 tablet (500 mg total) by mouth  2 (two) times daily. 10/25/13   Rolland Porter, MD   Meds Ordered and Administered this Visit  Medications - No data to display  BP 150/95 mmHg  Pulse 110  Temp(Src) 98.4 F (36.9 C) (Oral)  Resp 12  SpO2 98%  LMP 11/05/2012 No data found.   Physical Exam  Constitutional: She is oriented to person, place, and time. She appears well-developed and well-nourished. No distress.  Neck: Normal range of motion. Neck supple.  Cardiovascular: Normal rate.   Pulmonary/Chest: Effort normal. No respiratory distress.  Abdominal: Soft. She exhibits no distension. There is no tenderness.  Genitourinary:  Normal external female genitalia The mucosal aspect of the  labia majora has several small papules, flesh-colored, slightly raised. These areas are itchy. She denies associated burning or stinging but states during intercourse it does hurt. She states this might be due because she has vulvovaginal dryness. There are no vesicles. No erythema or ulcerations. There is a small amount of thick vaginal discharge cottage cheeselike area There is no cervix as she has a history of hysterectomy. No foreign bodies.  Musculoskeletal: Normal range of motion. She exhibits no edema.  Neurological: She is alert and oriented to person, place, and time. No cranial nerve deficit. She exhibits normal muscle tone.  Skin: Skin is warm and dry.  Psychiatric: She has a normal mood and affect.  Nursing note and vitals reviewed.   ED Course  Procedures (including critical care time)  Labs Review Labs Reviewed  POCT URINALYSIS DIP (DEVICE) - Abnormal; Notable for the following:    Hgb urine dipstick MODERATE (*)    All other components within normal limits  URINE CULTURE  HERPES SIMPLEX VIRUS CULTURE  CERVICOVAGINAL ANCILLARY ONLY   Results for orders placed or performed during the hospital encounter of 10/29/14  POCT urinalysis dip (device)  Result Value Ref Range   Glucose, UA NEGATIVE NEGATIVE mg/dL   Bilirubin Urine NEGATIVE NEGATIVE   Ketones, ur NEGATIVE NEGATIVE mg/dL   Specific Gravity, Urine 1.015 1.005 - 1.030   Hgb urine dipstick MODERATE (A) NEGATIVE   pH 7.0 5.0 - 8.0   Protein, ur NEGATIVE NEGATIVE mg/dL   Urobilinogen, UA 0.2 0.0 - 1.0 mg/dL   Nitrite NEGATIVE NEGATIVE   Leukocytes, UA NEGATIVE NEGATIVE    Imaging Review No results found.   Visual Acuity Review  Right Eye Distance:   Left Eye Distance:   Bilateral Distance:    Right Eye Near:   Left Eye Near:    Bilateral Near:         MDM   1. Vulvar itching   2. Hematuria, microscopic   3. Urinary frequency    We will presenting urinary tract infection for now if your  symptoms. Treat with Keflex as directed Do not apply any more of the over-the-counter pastes, lotions or other substances to the mucosal aspect of the vulva. May try hydrocortisone cream 1% twice a day for itching. A viral culture has been obtained to test for herpes simplex although clinically it is not consistent with this diagnosis. Diflucan as directed Cervical cytology pending Urine culture pending    Hayden Rasmussen, NP 10/29/14 1752

## 2014-10-30 NOTE — ED Notes (Signed)
Pt stated    She  Lost her  rx for  Keflex  Katherine [name]  Ok    A  Refill  Keflex 500 mg   Qid   Number  28  nr  Till  [facility]  On [address]

## 2014-10-31 LAB — URINE CULTURE: SPECIAL REQUESTS: NORMAL

## 2014-11-01 LAB — HERPES SIMPLEX VIRUS CULTURE: CULTURE: NOT DETECTED

## 2014-11-01 LAB — CERVICOVAGINAL ANCILLARY ONLY
Chlamydia: NEGATIVE
Neisseria Gonorrhea: NEGATIVE

## 2014-11-01 NOTE — ED Notes (Signed)
Still waiting for STD screening reports . Negative herpes, negative UA C&S.

## 2014-11-02 LAB — CERVICOVAGINAL ANCILLARY ONLY: WET PREP (BD AFFIRM): NEGATIVE

## 2014-11-02 NOTE — ED Notes (Signed)
STD screening swabs negative for chlamydia, GC

## 2014-11-05 NOTE — ED Notes (Signed)
Patient called to check on test results. After verifying ID, discussed negative findings. Was advised there were multiple species in urine culture, which would suggest no UTI, but if she desired , she could complete Rx if she thinks it is making her feel better

## 2014-11-18 ENCOUNTER — Emergency Department (HOSPITAL_COMMUNITY)
Admission: EM | Admit: 2014-11-18 | Discharge: 2014-11-18 | Disposition: A | Discharge status: Home / Self Care | Payer: Medicaid Other | Attending: Emergency Medicine | Admitting: Emergency Medicine

## 2014-11-18 ENCOUNTER — Encounter (HOSPITAL_COMMUNITY): Payer: Self-pay | Admitting: Emergency Medicine

## 2014-11-18 DIAGNOSIS — I2510 Atherosclerotic heart disease of native coronary artery without angina pectoris: Secondary | ICD-10-CM | POA: Insufficient documentation

## 2014-11-18 DIAGNOSIS — Z7982 Long term (current) use of aspirin: Secondary | ICD-10-CM | POA: Diagnosis not present

## 2014-11-18 DIAGNOSIS — Z720 Tobacco use: Secondary | ICD-10-CM | POA: Diagnosis not present

## 2014-11-18 DIAGNOSIS — I509 Heart failure, unspecified: Secondary | ICD-10-CM | POA: Diagnosis not present

## 2014-11-18 DIAGNOSIS — M62838 Other muscle spasm: Secondary | ICD-10-CM | POA: Insufficient documentation

## 2014-11-18 DIAGNOSIS — Z79899 Other long term (current) drug therapy: Secondary | ICD-10-CM | POA: Diagnosis not present

## 2014-11-18 DIAGNOSIS — I158 Other secondary hypertension: Secondary | ICD-10-CM

## 2014-11-18 DIAGNOSIS — I159 Secondary hypertension, unspecified: Secondary | ICD-10-CM | POA: Diagnosis not present

## 2014-11-18 DIAGNOSIS — I10 Essential (primary) hypertension: Secondary | ICD-10-CM | POA: Diagnosis not present

## 2014-11-18 DIAGNOSIS — J45909 Unspecified asthma, uncomplicated: Secondary | ICD-10-CM | POA: Diagnosis not present

## 2014-11-18 DIAGNOSIS — M545 Low back pain: Secondary | ICD-10-CM | POA: Diagnosis present

## 2014-11-18 LAB — URINALYSIS, ROUTINE W REFLEX MICROSCOPIC
Bilirubin Urine: NEGATIVE
GLUCOSE, UA: NEGATIVE mg/dL
KETONES UR: NEGATIVE mg/dL
LEUKOCYTES UA: NEGATIVE
Nitrite: NEGATIVE
PROTEIN: NEGATIVE mg/dL
Specific Gravity, Urine: 1.006 (ref 1.005–1.030)
Urobilinogen, UA: 0.2 mg/dL (ref 0.0–1.0)
pH: 7.5 (ref 5.0–8.0)

## 2014-11-18 LAB — BASIC METABOLIC PANEL
ANION GAP: 7 (ref 5–15)
BUN: 8 mg/dL (ref 6–20)
CALCIUM: 9.4 mg/dL (ref 8.9–10.3)
CO2: 29 mmol/L (ref 22–32)
Chloride: 103 mmol/L (ref 101–111)
Creatinine, Ser: 0.77 mg/dL (ref 0.44–1.00)
GFR calc Af Amer: >60 mL/min (ref >60)
GLUCOSE: 96 mg/dL (ref 65–99)
POTASSIUM: 3.3 mmol/L — AB (ref 3.5–5.1)
SODIUM: 139 mmol/L (ref 135–145)

## 2014-11-18 LAB — URINE MICROSCOPIC-ADD ON

## 2014-11-18 MED ORDER — DIAZEPAM 2 MG PO TABS: 2.0000 mg | ORAL_TABLET | ORAL | Status: DC | PRN

## 2014-11-18 MED ORDER — DIAZEPAM 5 MG PO TABS
5.0000 mg | ORAL_TABLET | Freq: Once | ORAL | Status: AC
  Administered 2014-11-18: 5 mg via ORAL
  Filled 2014-11-18: qty 1

## 2014-11-18 MED ORDER — POTASSIUM CHLORIDE CRYS ER 20 MEQ PO TBCR
40.0000 meq | EXTENDED_RELEASE_TABLET | Freq: Once | ORAL | Status: AC
  Administered 2014-11-18: 40 meq via ORAL
  Filled 2014-11-18: qty 2

## 2014-11-18 NOTE — ED Provider Notes (Signed)
CSN: [id number]     Arrival date & time 11/18/14  8657 History   First MD Initiated Contact with Patient 11/18/14 1021     Chief Complaint  Patient presents with  . Hypertension  . Back Pain     (Consider location/radiation/quality/duration/timing/severity/associated sxs/prior Treatment) HPI Comments: Patient here complaining of high blood pressure that was noted prior to receiving a dental procedure. She notes some back spasms 24 hours which radiated to her abdomen. Denies any anginal type chest pain. Blood pressure prior to arrival had a systolic of 180. She does have a history of hypertension but does not take any medications at this time. She denies any dyspnea. No vomiting, fever, diarrhea. Used Motrin prior to arrival.  Patient is a 49 y.o. female presenting with hypertension and back pain. The history is provided by the patient.  Hypertension  Back Pain   Past Medical History  Diagnosis Date  . Asthma   . CHF (congestive heart failure) (HCC)   . Coronary artery disease     MI 2007  . Asthma   . Bronchitis   . Hypertension    Past Surgical History  Procedure Laterality Date  . Abdominal hysterectomy    . Tubal ligation    . Hernia repair    . Tubal ligation      R only   Family History  Problem Relation Age of Onset  . Coronary artery disease     Social History  Substance Use Topics  . Smoking status: Current Every Day Smoker    Types: Cigarettes  . Smokeless tobacco: None  . Alcohol Use: Yes     Comment: occasionally   OB History    No data available     Review of Systems  Musculoskeletal: Positive for back pain.  All other systems reviewed and are negative.     Allergies  Review of patient's allergies indicates no known allergies.  Home Medications   Prior to Admission medications   Medication Sig Start Date End Date Taking? Authorizing Provider  albuterol (PROVENTIL HFA;VENTOLIN HFA) 108 (90 BASE) MCG/ACT inhaler Inhale 2 puffs into the  lungs every 6 (six) hours as needed for wheezing. 06/12/13   Rodolph Bong, MD  amLODipine (NORVASC) 10 MG tablet Take 1 tablet (10 mg total) by mouth daily. 06/12/13   Rodolph Bong, MD  aspirin EC 81 MG tablet Take 1 tablet (81 mg total) by mouth daily. 12/08/11   Osvaldo Shipper, MD  cephALEXin (KEFLEX) 500 MG capsule Take 1 capsule (500 mg total) by mouth 4 (four) times daily. 10/29/14   Hayden Rasmussen, NP  cyclobenzaprine (FLEXERIL) 10 MG tablet Take 1 tablet (10 mg total) by mouth 2 (two) times daily as needed for muscle spasms. 12/28/13   Roxy Horseman, PA-C  diazepam (VALIUM) 5 MG tablet Take 1 tablet (5 mg total) by mouth at bedtime as needed for anxiety (take at night for anxiety or insomnia). 10/25/13   Rolland Porter, MD  fluconazole (DIFLUCAN) 150 MG tablet 1 tab po x 1. May repeat in 72 hours if no improvement 10/29/14   Hayden Rasmussen, NP  HYDROcodone-acetaminophen (NORCO/VICODIN) 5-325 MG per tablet Take 1-2 tablets by mouth every 6 (six) hours as needed for moderate pain. 12/29/13   Vanetta Mulders, MD  ibuprofen (ADVIL,MOTRIN) 600 MG tablet Take 1 tablet (600 mg total) by mouth every 6 (six) hours as needed. 12/28/13   Roxy Horseman, PA-C  LORazepam (ATIVAN) 1 MG tablet Take 1 tablet (1 mg total)  by mouth 3 (three) times daily as needed for anxiety. 06/28/14   Jennifer Piepenbrink, PA-C  methocarbamol (ROBAXIN) 500 MG tablet Take 1 tablet (500 mg total) by mouth 2 (two) times daily. 10/25/13   Rolland Porter, MD   BP 179/96 mmHg  Pulse 88  Temp(Src) 98.9 F (37.2 C) (Oral)  Resp 16  SpO2 98%  LMP 11/05/2012 Physical Exam  Constitutional: She is oriented to person, place, and time. She appears well-developed and well-nourished.  Non-toxic appearance. No distress.  HENT:  Head: Normocephalic and atraumatic.  Eyes: Conjunctivae, EOM and lids are normal. Pupils are equal, round, and reactive to light.  Neck: Normal range of motion. Neck supple. No tracheal deviation present. No thyroid mass present.   Cardiovascular: Normal rate, regular rhythm and normal heart sounds.  Exam reveals no gallop.   No murmur heard. Pulmonary/Chest: Effort normal and breath sounds normal. No stridor. No respiratory distress. She has no decreased breath sounds. She has no wheezes. She has no rhonchi. She has no rales.  Abdominal: Soft. Normal appearance and bowel sounds are normal. She exhibits no distension. There is no tenderness. There is no rebound and no CVA tenderness.  Musculoskeletal: Normal range of motion. She exhibits no edema or tenderness.  Neurological: She is alert and oriented to person, place, and time. She has normal strength. No cranial nerve deficit or sensory deficit. GCS eye subscore is 4. GCS verbal subscore is 5. GCS motor subscore is 6.  Skin: Skin is warm and dry. No abrasion and no rash noted.  Psychiatric: She has a normal mood and affect. Her speech is normal and behavior is normal.  Nursing note and vitals reviewed.   ED Course  Procedures (including critical care time) Labs Review Labs Reviewed  URINALYSIS, ROUTINE W REFLEX MICROSCOPIC (NOT AT [facility])  BASIC METABOLIC PANEL    Imaging Review No results found. I have personally reviewed and evaluated these images and lab results as part of my medical decision-making.   EKG Interpretation None      MDM   Final diagnoses:  None    Patient's blood pressure repeated here and slightly elevated. Was given Valium here and feels better for muscle spasms. Has appointment tomorrow with her PCP and was instructed to follow-up with her high blood pressure with that physician    Lorre Nick, MD 11/18/14 1248

## 2014-11-18 NOTE — Discharge Instructions (Signed)
Hypertension °Hypertension, commonly called high blood pressure, is when the force of blood pumping through your arteries is too strong. Your arteries are the blood vessels that carry blood from your heart throughout your body. A blood pressure reading consists of a higher number over a lower number, such as 110/72. The higher number (systolic) is the pressure inside your arteries when your heart pumps. The lower number (diastolic) is the pressure inside your arteries when your heart relaxes. Ideally you want your blood pressure below 120/80. °Hypertension forces your heart to work harder to pump blood. Your arteries may become narrow or stiff. Having untreated or uncontrolled hypertension can cause heart attack, stroke, kidney disease, and other problems. °RISK FACTORS °Some risk factors for high blood pressure are controllable. Others are not.  °Risk factors you cannot control include:  °· Race. You may be at higher risk if you are African American. °· Age. Risk increases with age. °· Gender. Men are at higher risk than women before age 45 years. After age 65, women are at higher risk than men. °Risk factors you can control include: °· Not getting enough exercise or physical activity. °· Being overweight. °· Getting too much fat, sugar, calories, or salt in your diet. °· Drinking too much alcohol. °SIGNS AND SYMPTOMS °Hypertension does not usually cause signs or symptoms. Extremely high blood pressure (hypertensive crisis) may cause headache, anxiety, shortness of breath, and nosebleed. °DIAGNOSIS °To check if you have hypertension, your health care provider will measure your blood pressure while you are seated, with your arm held at the level of your heart. It should be measured at least twice using the same arm. Certain conditions can cause a difference in blood pressure between your right and left arms. A blood pressure reading that is higher than normal on one occasion does not mean that you need treatment. If  it is not clear whether you have high blood pressure, you may be asked to return on a different day to have your blood pressure checked again. Or, you may be asked to monitor your blood pressure at home for 1 or more weeks. °TREATMENT °Treating high blood pressure includes making lifestyle changes and possibly taking medicine. Living a healthy lifestyle can help lower high blood pressure. You may need to change some of your habits. °Lifestyle changes may include: °· Following the DASH diet. This diet is high in fruits, vegetables, and whole grains. It is low in salt, red meat, and added sugars. °· Keep your sodium intake below 2,300 mg per day. °· Getting at least 30-45 minutes of aerobic exercise at least 4 times per week. °· Losing weight if necessary. °· Not smoking. °· Limiting alcoholic beverages. °· Learning ways to reduce stress. °Your health care provider may prescribe medicine if lifestyle changes are not enough to get your blood pressure under control, and if one of the following is true: °· You are 18-59 years of age and your systolic blood pressure is above 140. °· You are 60 years of age or older, and your systolic blood pressure is above 150. °· Your diastolic blood pressure is above 90. °· You have diabetes, and your systolic blood pressure is over 140 or your diastolic blood pressure is over 90. °· You have kidney disease and your blood pressure is above 140/90. °· You have heart disease and your blood pressure is above 140/90. °Your personal target blood pressure may vary depending on your medical conditions, your age, and other factors. °HOME CARE INSTRUCTIONS °·   Have your blood pressure rechecked as directed by your health care provider.   °· Take medicines only as directed by your health care provider. Follow the directions carefully. Blood pressure medicines must be taken as prescribed. The medicine does not work as well when you skip doses. Skipping doses also puts you at risk for  problems. °· Do not smoke.   °· Monitor your blood pressure at home as directed by your health care provider.  °SEEK MEDICAL CARE IF:  °· You think you are having a reaction to medicines taken. °· You have recurrent headaches or feel dizzy. °· You have swelling in your ankles. °· You have trouble with your vision. °SEEK IMMEDIATE MEDICAL CARE IF: °· You develop a severe headache or confusion. °· You have unusual weakness, numbness, or feel faint. °· You have severe chest or abdominal pain. °· You vomit repeatedly. °· You have trouble breathing. °MAKE SURE YOU:  °· Understand these instructions. °· Will watch your condition. °· Will get help right away if you are not doing well or get worse. °  °This information is not intended to replace advice given to you by your health care provider. Make sure you discuss any questions you have with your health care provider. °  °Document Released: 01/29/2005 Document Revised: 06/15/2014 Document Reviewed: 11/21/2012 °Elsevier Interactive Patient Education ©2016 [employer] °Muscle Cramps and Spasms °Muscle cramps and spasms occur when a muscle or muscles tighten and you have no control over this tightening (involuntary muscle contraction). They are a common problem and can develop in any muscle. The most common place is in the calf muscles of the leg. Both muscle cramps and muscle spasms are involuntary muscle contractions, but they also have differences:  °· Muscle cramps are sporadic and painful. They may last a few seconds to a quarter of an hour. Muscle cramps are often more forceful and last longer than muscle spasms. °· Muscle spasms may or may not be painful. They may also last just a few seconds or much longer. °CAUSES  °It is uncommon for cramps or spasms to be due to a serious underlying problem. In many cases, the cause of cramps or spasms is unknown. Some common causes are:  °· Overexertion.   °· Overuse from repetitive motions (doing the same thing over and over).    °· Remaining in a certain position for a long period of time.   °· Improper preparation, form, or technique while performing a sport or activity.   °· Dehydration.   °· Injury.   °· Side effects of some medicines.   °· Abnormally low levels of the salts and ions in your blood (electrolytes), especially potassium and calcium. This could happen if you are taking water pills (diuretics) or you are pregnant.   °Some underlying medical problems can make it more likely to develop cramps or spasms. These include, but are not limited to:  °· Diabetes.   °· Parkinson disease.   °· Hormone disorders, such as thyroid problems.   °· Alcohol abuse.   °· Diseases specific to muscles, joints, and bones.   °· Blood vessel disease where not enough blood is getting to the muscles.   °HOME CARE INSTRUCTIONS  °· Stay well hydrated. Drink enough water and fluids to keep your urine clear or pale yellow. °· It may be helpful to massage, stretch, and relax the affected muscle. °· For tight or tense muscles, use a warm towel, heating pad, or hot shower water directed to the affected area. °· If you are sore or have pain after a cramp or spasm, applying   ice to the affected area may relieve discomfort. °¨ Put ice in a plastic bag. °¨ Place a towel between your skin and the bag. °¨ Leave the ice on for 15-20 minutes, 03-04 times a day. °· Medicines used to treat a known cause of cramps or spasms may help reduce their frequency or severity. Only take over-the-counter or prescription medicines as directed by your caregiver. °SEEK MEDICAL CARE IF:  °Your cramps or spasms get more severe, more frequent, or do not improve over time.  °MAKE SURE YOU:  °· Understand these instructions. °· Will watch your condition. °· Will get help right away if you are not doing well or get worse. °  °This information is not intended to replace advice given to you by your health care provider. Make sure you discuss any questions you have with your health care  provider. °  °Document Released: 07/21/2001 Document Revised: 05/26/2012 Document Reviewed: 01/16/2012 °Elsevier Interactive Patient Education ©2016 [employer] ° °

## 2014-11-18 NOTE — ED Notes (Signed)
Pt to dentist office this AM for deep teeth cleaning. Was told BP was too high and patient came to ED. Pt states has an appt with new PCP tomorrow. Here alert, oriented x4, c/o fatigue, dizziness, headache. Also c/o low back pain which began last night. Pt ambulatory from wheelchair to bed. NAD at present.

## 2015-04-16 ENCOUNTER — Emergency Department (HOSPITAL_COMMUNITY)
Admission: EM | Admit: 2015-04-16 | Discharge: 2015-04-16 | Disposition: A | Discharge status: Home / Self Care | Payer: Medicaid Other | Attending: Emergency Medicine | Admitting: Emergency Medicine

## 2015-04-16 ENCOUNTER — Emergency Department (HOSPITAL_COMMUNITY): Admission: EM | Admit: 2015-04-16 | Discharge: 2015-04-16 | Discharge status: Absent without leave | Payer: Self-pay

## 2015-04-16 ENCOUNTER — Encounter (HOSPITAL_COMMUNITY): Payer: Self-pay | Admitting: Emergency Medicine

## 2015-04-16 DIAGNOSIS — M791 Myalgia: Secondary | ICD-10-CM | POA: Diagnosis not present

## 2015-04-16 DIAGNOSIS — M79671 Pain in right foot: Secondary | ICD-10-CM | POA: Insufficient documentation

## 2015-04-16 DIAGNOSIS — I10 Essential (primary) hypertension: Secondary | ICD-10-CM | POA: Diagnosis not present

## 2015-04-16 DIAGNOSIS — G8929 Other chronic pain: Secondary | ICD-10-CM | POA: Diagnosis not present

## 2015-04-16 DIAGNOSIS — I509 Heart failure, unspecified: Secondary | ICD-10-CM | POA: Insufficient documentation

## 2015-04-16 DIAGNOSIS — Z7982 Long term (current) use of aspirin: Secondary | ICD-10-CM | POA: Diagnosis not present

## 2015-04-16 DIAGNOSIS — I252 Old myocardial infarction: Secondary | ICD-10-CM | POA: Diagnosis not present

## 2015-04-16 DIAGNOSIS — F17210 Nicotine dependence, cigarettes, uncomplicated: Secondary | ICD-10-CM | POA: Diagnosis not present

## 2015-04-16 DIAGNOSIS — J45901 Unspecified asthma with (acute) exacerbation: Secondary | ICD-10-CM | POA: Diagnosis not present

## 2015-04-16 DIAGNOSIS — K5900 Constipation, unspecified: Secondary | ICD-10-CM | POA: Diagnosis not present

## 2015-04-16 DIAGNOSIS — Z79899 Other long term (current) drug therapy: Secondary | ICD-10-CM | POA: Insufficient documentation

## 2015-04-16 DIAGNOSIS — I2510 Atherosclerotic heart disease of native coronary artery without angina pectoris: Secondary | ICD-10-CM | POA: Insufficient documentation

## 2015-04-16 DIAGNOSIS — R202 Paresthesia of skin: Secondary | ICD-10-CM | POA: Insufficient documentation

## 2015-04-16 MED ORDER — ACETAMINOPHEN 325 MG PO TABS
650.0000 mg | ORAL_TABLET | Freq: Once | ORAL | Status: AC
  Administered 2015-04-16: 650 mg via ORAL
  Filled 2015-04-16: qty 2

## 2015-04-16 MED ORDER — AMLODIPINE BESYLATE 5 MG PO TABS: 5.0000 mg | ORAL_TABLET | Freq: Every day | ORAL | Status: DC

## 2015-04-16 MED ORDER — AMLODIPINE BESYLATE 5 MG PO TABS
5.0000 mg | ORAL_TABLET | Freq: Once | ORAL | Status: AC
  Administered 2015-04-16: 5 mg via ORAL
  Filled 2015-04-16: qty 1

## 2015-04-16 NOTE — ED Notes (Signed)
Pt c/o high blood pressure x 3 days. Pt has history of HTN and was on medication 3 years ago but it made her cough. Pt c/o right heel pain x 1 1/2 months.

## 2015-04-16 NOTE — ED Provider Notes (Signed)
S: Katherine [name] is a 50 y.o. female presents to the ED with complaints of increasing hypertension over the last several days. Patient has a history of same but has not taken medications in approximately 6 years. She presented an ACE inhibitor which made her cough therefore she stopped taking it.  She has taken some over-the-counter therapies for her blood pressure but they have not worked.  She does not have associated headache, neck pain, chest pain, abdominal pain, nausea, vomiting, diarrhea, dysuria, hematuria, fevers, chills.   Patient also complaining of severe right heel pain present for the last 1.5 months area did she injured her calcaneus approximately 3 years ago but was released from her orthopedist. She reinjured the area out last August she fell off a however board. Patient reports pain is worse in the morning. Tylenol and Aleve do not improve it.  O:  General: Awake  HEENT: Atraumatic  Resp: Normal effort  Cardiac: RRR Abd: Nondistended, soft  MSK: Tenderness to palpation over the right calcaneus, no pitting edema Neuro:No focal weakness  Skin: No rash   A/P:  Pt with hypertension here in the emergency department. No evidence of hypertensive urgency.  Patient admits that she is here in the emergency room because she attempted to make an appointment with her primary care physician but was unable to be seen until May. She reports that her primary care physician told her to present to the emergency department for prescription of antihypertensive.  Conservative therapies for patient's calcaneal pain. Patient given prescription for Norvasc.  She is to follow with primary care as soon as possible.  BP 139/98 mmHg  Pulse 95  Temp(Src) 98.6 F (37 C) (Oral)  Resp 18  Ht 5\' 6"  (1.676 m)  Wt 81.307 kg  BMI 28.95 kg/m2  SpO2 100%  LMP 11/05/2012   Pt was seen by [name], PA-C and personally evaluated by myself with [name], MD supervising.     1. Essential hypertension    2. Heel pain, chronic, right     [name], PA-C 04/16/15 2140  [name], MD 04/16/15 2234

## 2015-04-16 NOTE — Discharge Instructions (Signed)
Medications: Amlodipine and acetaminophen    Treatment: Take amlodipine as prescribed. You can buy extra strength Tylenol ( ) over-the-counter for your heel pain. NEVER exceed  of Tylenol per day. I recommend taking it every 6 hours as needed for your heel pain. Wear the brace that you were fitted for today at night while you sleep. Also, massage painful area with a tennis or golf ball, as well as ice and massage with a frozen juice can. You can stretch your foot against the wall whenever you remember throughout the day.   Follow-up: Please follow-up with your primary care provider as soon as possible for further evaluation of your high blood pressure and heel pain. If you begin to experience any fevers, changes in vision, headaches, blood in the urine, or worsening dizziness, please return to the emergency department.   Hypertension Hypertension, commonly called high blood pressure, is when the force of blood pumping through your arteries is too strong. Your arteries are the blood vessels that carry blood from your heart throughout your body. A blood pressure reading consists of a higher number over a lower number, such as 110/72. The higher number (systolic) is the pressure inside your arteries when your heart pumps. The lower number (diastolic) is the pressure inside your arteries when your heart relaxes. Ideally you want your blood pressure below 120/80. Hypertension forces your heart to work harder to pump blood. Your arteries may become narrow or stiff. Having untreated or uncontrolled hypertension can cause heart attack, stroke, kidney disease, and other problems. RISK FACTORS Some risk factors for high blood pressure are controllable. Others are not.  Risk factors you cannot control include:   Race. You may be at higher risk if you are African American.  Age. Risk increases with age.  Gender. Men are at higher risk than women before age 63 years. After age 17, women are at higher  risk than men. Risk factors you can control include:  Not getting enough exercise or physical activity.  Being overweight.  Getting too much fat, sugar, calories, or salt in your diet.  Drinking too much alcohol. SIGNS AND SYMPTOMS Hypertension does not usually cause signs or symptoms. Extremely high blood pressure (hypertensive crisis) may cause headache, anxiety, shortness of breath, and nosebleed. DIAGNOSIS To check if you have hypertension, your health care provider will measure your blood pressure while you are seated, with your arm held at the level of your heart. It should be measured at least twice using the same arm. Certain conditions can cause a difference in blood pressure between your right and left arms. A blood pressure reading that is higher than normal on one occasion does not mean that you need treatment. If it is not clear whether you have high blood pressure, you may be asked to return on a different day to have your blood pressure checked again. Or, you may be asked to monitor your blood pressure at home for 1 or more weeks. TREATMENT Treating high blood pressure includes making lifestyle changes and possibly taking medicine. Living a healthy lifestyle can help lower high blood pressure. You may need to change some of your habits. Lifestyle changes may include:  Following the DASH diet. This diet is high in fruits, vegetables, and whole grains. It is low in salt, red meat, and added sugars.  Keep your sodium intake below 2,300 mg per day.  Getting at least 30-45 minutes of aerobic exercise at least 4 times per week.  Losing weight if necessary.  Not smoking.  Limiting alcoholic beverages.  Learning ways to reduce stress. Your health care provider may prescribe medicine if lifestyle changes are not enough to get your blood pressure under control, and if one of the following is true:  You are 6118-52 years of age and your systolic blood pressure is above 140.  You  are 52 years of age or older, and your systolic blood pressure is above 150.  Your diastolic blood pressure is above 90.  You have diabetes, and your systolic blood pressure is over 140 or your diastolic blood pressure is over 90.  You have kidney disease and your blood pressure is above 140/90.  You have heart disease and your blood pressure is above 140/90. Your personal target blood pressure may vary depending on your medical conditions, your age, and other factors. HOME CARE INSTRUCTIONS  Have your blood pressure rechecked as directed by your health care provider.   Take medicines only as directed by your health care provider. Follow the directions carefully. Blood pressure medicines must be taken as prescribed. The medicine does not work as well when you skip doses. Skipping doses also puts you at risk for problems.  Do not smoke.   Monitor your blood pressure at home as directed by your health care provider. SEEK MEDICAL CARE IF:   You think you are having a reaction to medicines taken.  You have recurrent headaches or feel dizzy.  You have swelling in your ankles.  You have trouble with your vision. SEEK IMMEDIATE MEDICAL CARE IF:  You develop a severe headache or confusion.  You have unusual weakness, numbness, or feel faint.  You have severe chest or abdominal pain.  You vomit repeatedly.  You have trouble breathing. MAKE SURE YOU:   Understand these instructions.  Will watch your condition.  Will get help right away if you are not doing well or get worse.   This information is not intended to replace advice given to you by your health care provider. Make sure you discuss any questions you have with your health care provider.   Document Released: 01/29/2005 Document Revised: 06/15/2014 Document Reviewed: 11/21/2012 Elsevier Interactive Patient Education [employer]2016 [employer]

## 2015-04-16 NOTE — ED Provider Notes (Signed)
CSN: [id number]     Arrival date & time 04/16/15  1730 History   First MD Initiated Contact with Patient 04/16/15 1811     Chief Complaint  Patient presents with  . Hypertension  . Foot Pain     (Consider location/radiation/quality/duration/timing/severity/associated sxs/prior Treatment) HPI Comments: Patient is a 49yo AAF who presents today with a 3 day history of HTN per the patient's measurements at [employer] (158/97, 168/101, 170/107). She has some associated light-headedness when she gets up in the morning. She has a history of HTN and was prescribed an ACE inhibitor about 6 years ago that made her cough, so she stopped taking it. She was not placed on another medication. She was told at subsequent work physicals that her blood pressure was fine. She has not seen her PCP in about 6 years. She has tried water pills and a garlic pill from the dollar store, as well as drinking cherry and pomegranate juice. She believes these, or a combination of these, may have helped her blood pressure. The patient denies any headaches, changes in vision, chest pain, abdominal pain, N/V, dysuria. The patient occasionally has shortness of breath due to her asthma.  Patient also reports severe right heel pain that has been constant for the past 1.5 months. She injured the area about 3 years ago and was released from her orthopedist. She had no pain until August when she fell off a hoverboard. She has had intermittent pain since then but for the past 1.5 months, it has been hurting her every morning to the point of hobbling. She describes an associated "pins and needles" over the previously injured area. She had been using Aleve with relief, but recently, it has not been working.   Patient is a 50 y.o. female presenting with hypertension and lower extremity pain. The history is provided by the patient.  Hypertension Associated symptoms include myalgias. Pertinent negatives include no abdominal pain, chest pain, fever,  headaches, nausea, neck pain, numbness, rash, vomiting or weakness.  Foot Pain Associated symptoms include myalgias. Pertinent negatives include no abdominal pain, chest pain, fever, headaches, nausea, neck pain, numbness, rash, vomiting or weakness.    Past Medical History  Diagnosis Date  . Asthma   . CHF (congestive heart failure) (HCC)   . Coronary artery disease     MI 2007  . Asthma   . Bronchitis   . Hypertension    Past Surgical History  Procedure Laterality Date  . Abdominal hysterectomy    . Tubal ligation    . Hernia repair    . Tubal ligation      R only   Family History  Problem Relation Age of Onset  . Coronary artery disease     Social History  Substance Use Topics  . Smoking status: Current Every Day Smoker    Types: Cigarettes  . Smokeless tobacco: None  . Alcohol Use: Yes     Comment: occasionally   OB History    No data available     Review of Systems  Constitutional: Negative for fever.  HENT: Negative for facial swelling.   Respiratory: Positive for shortness of breath (occasionally from asthma, resolved with albuterol inhaler). Negative for chest tightness and wheezing.   Cardiovascular: Negative for chest pain and leg swelling.  Gastrointestinal: Positive for constipation. Negative for nausea, vomiting, abdominal pain and diarrhea.  Genitourinary: Negative for dysuria and hematuria.  Musculoskeletal: Positive for myalgias. Negative for back pain and neck pain.  Skin: Negative for rash  and wound.  Neurological: Positive for light-headedness. Negative for weakness, numbness and headaches.  Psychiatric/Behavioral: The patient is not nervous/anxious.       Allergies  Review of patient's allergies indicates no known allergies.  Home Medications   Prior to Admission medications   Medication Sig Start Date End Date Taking? Authorizing Provider  albuterol (PROVENTIL HFA;VENTOLIN HFA) 108 (90 BASE) MCG/ACT inhaler Inhale 2 puffs into the  lungs every 6 (six) hours as needed for wheezing. 06/12/13   Rodolph Bong, MD  amLODipine (NORVASC) 5 MG tablet Take 1 tablet (5 mg total) by mouth daily. 04/16/15   Emi Holes, PA-C  aspirin EC 81 MG tablet Take 1 tablet (81 mg total) by mouth daily. 12/08/11   Osvaldo Shipper, MD  cephALEXin (KEFLEX) 500 MG capsule Take 1 capsule (500 mg total) by mouth 4 (four) times daily. Patient not taking: Reported on 11/18/2014 10/29/14   Hayden Rasmussen, NP  cyclobenzaprine (FLEXERIL) 10 MG tablet Take 1 tablet (10 mg total) by mouth 2 (two) times daily as needed for muscle spasms. Patient not taking: Reported on 11/18/2014 12/28/13   Roxy Horseman, PA-C  diazepam (VALIUM) 2 MG tablet Take 1 tablet (2 mg total) by mouth every 4 (four) hours as needed for muscle spasms. 11/18/14   Lorre Nick, MD  fluconazole (DIFLUCAN) 150 MG tablet 1 tab po x 1. May repeat in 72 hours if no improvement Patient not taking: Reported on 11/18/2014 10/29/14   Hayden Rasmussen, NP  HYDROcodone-acetaminophen (NORCO/VICODIN) 5-325 MG per tablet Take 1-2 tablets by mouth every 6 (six) hours as needed for moderate pain. Patient not taking: Reported on 11/18/2014 12/29/13   Vanetta Mulders, MD  ibuprofen (ADVIL,MOTRIN) 600 MG tablet Take 1 tablet (600 mg total) by mouth every 6 (six) hours as needed. 12/28/13   Roxy Horseman, PA-C  LORazepam (ATIVAN) 1 MG tablet Take 1 tablet (1 mg total) by mouth 3 (three) times daily as needed for anxiety. Patient not taking: Reported on 11/18/2014 06/28/14   Francee Piccolo, PA-C  methocarbamol (ROBAXIN) 500 MG tablet Take 1 tablet (500 mg total) by mouth 2 (two) times daily. Patient not taking: Reported on 11/18/2014 10/25/13   Rolland Porter, MD   BP 148/85 mmHg  Pulse 94  Temp(Src) 98.4 F (36.9 C) (Oral)  Resp 16  Ht  (1.676 m)  Wt 81.307 kg  BMI 28.95 kg/m2  SpO2 100%  LMP 11/05/2012 Physical Exam  Constitutional: She appears well-developed and well-nourished.  HENT:  Head: Normocephalic  and atraumatic.  Eyes: Conjunctivae are normal. Pupils are equal, round, and reactive to light. Right eye exhibits no discharge. Left eye exhibits no discharge. No scleral icterus.  Neck: Normal range of motion.  Cardiovascular: Normal rate, regular rhythm and normal heart sounds.  Exam reveals no gallop and no friction rub.   No murmur heard. Pulmonary/Chest: Effort normal and breath sounds normal. No respiratory distress. She has no wheezes. She has no rales.  Abdominal: Soft. Bowel sounds are normal. She exhibits no distension. There is no tenderness. There is no rebound and no guarding.  Musculoskeletal: She exhibits no edema.       Right ankle: Achilles tendon exhibits no pain.       Feet:  No numbness or weakness in ankle on exam, normal cap refill  Neurological: She is alert.  Skin: Skin is warm and dry. No rash noted. No erythema. No pallor.  Psychiatric: She has a normal mood and affect.  Nursing note and  vitals reviewed.   ED Course  Procedures (including critical care time) Labs Review Labs Reviewed - No data to display  Imaging Review No results found.  I have personally reviewed and evaluated these images and lab results as part of my medical decision-making.   MDM   Patient is a 49yo AAF who presents with a 3 day history of self-measured HTN. She reported 158/97, 168/101, 170/107. She is not currently on any prescribed antihypertensive medication. She tried an over-the-counter garlic pill and water pill. Patient denies any changes in vision, hematuria, headaches, chest pain, N/V. She was given amlodipine in the emergency department. Patient also reported a chronic R heel pain relating to a prior injury of the area. The patient is tender to the touch over the plantar fascia and medial and lateral heel. The pain is worse in the morning and improves as she walks on it. Patient was fitted with an ASO in the ED to wear at night and advised she can take Extra Strength Tylenol  q6h PRN for pain over-the-counter. She was advised not to exceed 4000mg  per day.  Final diagnoses:  Essential hypertension  Heel pain, chronic, right        Emi Holeslexandra M Kenzley Ke, PA-C 04/16/15 2018  [name], MD 04/16/15 2233

## 2015-04-16 NOTE — ED Notes (Signed)
Pt verbalized understanding of d/c instructions and has no further questions. Pt stable and nAD 

## 2015-07-16 ENCOUNTER — Encounter (HOSPITAL_COMMUNITY): Payer: Self-pay

## 2015-07-16 ENCOUNTER — Emergency Department (HOSPITAL_COMMUNITY)
Admission: EM | Admit: 2015-07-16 | Discharge: 2015-07-16 | Disposition: A | Discharge status: Home / Self Care | Payer: Medicaid Other | Attending: Emergency Medicine | Admitting: Emergency Medicine

## 2015-07-16 DIAGNOSIS — I10 Essential (primary) hypertension: Secondary | ICD-10-CM | POA: Insufficient documentation

## 2015-07-16 DIAGNOSIS — X58XXXA Exposure to other specified factors, initial encounter: Secondary | ICD-10-CM | POA: Diagnosis not present

## 2015-07-16 DIAGNOSIS — Y9289 Other specified places as the place of occurrence of the external cause: Secondary | ICD-10-CM | POA: Insufficient documentation

## 2015-07-16 DIAGNOSIS — S29002A Unspecified injury of muscle and tendon of back wall of thorax, initial encounter: Secondary | ICD-10-CM | POA: Diagnosis present

## 2015-07-16 DIAGNOSIS — M62830 Muscle spasm of back: Secondary | ICD-10-CM | POA: Diagnosis not present

## 2015-07-16 DIAGNOSIS — I509 Heart failure, unspecified: Secondary | ICD-10-CM | POA: Diagnosis not present

## 2015-07-16 DIAGNOSIS — I2510 Atherosclerotic heart disease of native coronary artery without angina pectoris: Secondary | ICD-10-CM | POA: Insufficient documentation

## 2015-07-16 DIAGNOSIS — J45909 Unspecified asthma, uncomplicated: Secondary | ICD-10-CM | POA: Diagnosis not present

## 2015-07-16 DIAGNOSIS — S4991XA Unspecified injury of right shoulder and upper arm, initial encounter: Secondary | ICD-10-CM | POA: Insufficient documentation

## 2015-07-16 DIAGNOSIS — Y998 Other external cause status: Secondary | ICD-10-CM | POA: Insufficient documentation

## 2015-07-16 DIAGNOSIS — S6991XA Unspecified injury of right wrist, hand and finger(s), initial encounter: Secondary | ICD-10-CM | POA: Insufficient documentation

## 2015-07-16 DIAGNOSIS — F17210 Nicotine dependence, cigarettes, uncomplicated: Secondary | ICD-10-CM | POA: Diagnosis not present

## 2015-07-16 DIAGNOSIS — M62838 Other muscle spasm: Secondary | ICD-10-CM

## 2015-07-16 DIAGNOSIS — S29012A Strain of muscle and tendon of back wall of thorax, initial encounter: Secondary | ICD-10-CM | POA: Insufficient documentation

## 2015-07-16 DIAGNOSIS — R220 Localized swelling, mass and lump, head: Secondary | ICD-10-CM | POA: Diagnosis not present

## 2015-07-16 DIAGNOSIS — S46811A Strain of other muscles, fascia and tendons at shoulder and upper arm level, right arm, initial encounter: Secondary | ICD-10-CM

## 2015-07-16 DIAGNOSIS — Y9389 Activity, other specified: Secondary | ICD-10-CM | POA: Diagnosis not present

## 2015-07-16 MED ORDER — HYDROCODONE-ACETAMINOPHEN 5-325 MG PO TABS
1.0000 | ORAL_TABLET | Freq: Once | ORAL | Status: AC
  Administered 2015-07-16: 1 via ORAL
  Filled 2015-07-16: qty 1

## 2015-07-16 MED ORDER — NAPROXEN 250 MG PO TABS
500.0000 mg | ORAL_TABLET | Freq: Once | ORAL | Status: AC
  Administered 2015-07-16: 500 mg via ORAL
  Filled 2015-07-16: qty 2

## 2015-07-16 MED ORDER — METHOCARBAMOL 500 MG PO TABS: 500.0000 mg | ORAL_TABLET | Freq: Three times a day (TID) | ORAL | Status: DC | PRN

## 2015-07-16 MED ORDER — HYDROCODONE-ACETAMINOPHEN 5-325 MG PO TABS: 1.0000 | ORAL_TABLET | Freq: Four times a day (QID) | ORAL | Status: DC | PRN

## 2015-07-16 MED ORDER — NAPROXEN 500 MG PO TABS: 500.0000 mg | ORAL_TABLET | Freq: Two times a day (BID) | ORAL | Status: DC | PRN

## 2015-07-16 MED ORDER — METHOCARBAMOL 500 MG PO TABS
1000.0000 mg | ORAL_TABLET | Freq: Once | ORAL | Status: AC
  Administered 2015-07-16: 1000 mg via ORAL
  Filled 2015-07-16: qty 2

## 2015-07-16 NOTE — Discharge Instructions (Signed)
Take naprosyn as directed for inflammation and pain with norco for breakthrough pain and robaxin for muscle relaxation. Do not drive or operate machinery with pain medication or muscle relaxant use. Use heat to the areas of soreness, no more than 20 minutes for every hour. Avoid computer activities while your arm is having pain, and always use proper posture when at a computer or desk. Follow up with primary care physician for recheck of ongoing symptoms in the next 1 week. Return to ER for emergent changing or worsening of symptoms.     Muscle Cramps and Spasms Muscle cramps and spasms are when muscles tighten by themselves. They usually get better within minutes. Muscle cramps are painful. They are usually stronger and last longer than muscle spasms. Muscle spasms may or may not be painful. They can last a few seconds or much longer. HOME CARE  Drink enough fluid to keep your pee (urine) clear or pale yellow.  Massage, stretch, and relax the muscle.  Use a warm towel, heating pad, or warm shower water on tight muscles.  Place ice on the muscle if it is tender or in pain.  Put ice in a plastic bag.  Place a towel between your skin and the bag.  Leave the ice on for 15-20 minutes, 03-04 times a day.  Only take medicine as told by your doctor. GET HELP RIGHT AWAY IF:  Your cramps or spasms get worse, happen more often, or do not get better with time. MAKE SURE YOU:  Understand these instructions.  Will watch your condition.  Will get help right away if you are not doing well or get worse.   This information is not intended to replace advice given to you by your health care provider. Make sure you discuss any questions you have with your health care provider.   Document Released: 01/12/2008 Document Revised: 05/26/2012 Document Reviewed: 01/16/2012 Elsevier Interactive Patient Education 2016 [employer]  Muscle Strain A muscle strain (pulled muscle) happens when a muscle is  stretched beyond normal length. It happens when a sudden, violent force stretches your muscle too far. Usually, a few of the fibers in your muscle are torn. Muscle strain is common in athletes. Recovery usually takes 1-2 weeks. Complete healing takes 5-6 weeks.  HOME CARE   Follow the PRICE method of treatment to help your injury get better. Do this the first 2-3 days after the injury:  Protect. Protect the muscle to keep it from getting injured again.  Rest. Limit your activity and rest the injured body part.  Ice. Put ice in a plastic bag. Place a towel between your skin and the bag. Then, apply the ice and leave it on from 15-20 minutes each hour. After the third day, switch to moist heat packs.  Compression. Use a splint or elastic bandage on the injured area for comfort. Do not put it on too tightly.  Elevate. Keep the injured body part above the level of your heart.  Only take medicine as told by your doctor.  Warm up before doing exercise to prevent future muscle strains. GET HELP IF:   You have more pain or puffiness (swelling) in the injured area.  You feel numbness, tingling, or notice a loss of strength in the injured area. MAKE SURE YOU:   Understand these instructions.  Will watch your condition.  Will get help right away if you are not doing well or get worse.   This information is not intended to replace advice  given to you by your health care provider. Make sure you discuss any questions you have with your health care provider.   Document Released: 11/08/2007 Document Revised: 11/19/2012 Document Reviewed: 08/28/2012 Elsevier Interactive Patient Education 2016 [employer]  [employer] Therapy Heat therapy can help ease sore, stiff, injured, and tight muscles and joints. Heat relaxes your muscles, which may help ease your pain. Heat therapy should only be used on old, pre-existing, or long-lasting (chronic) injuries. Do not use heat therapy unless told by your  doctor. HOW TO USE HEAT THERAPY There are several different kinds of heat therapy, including:  Moist heat pack.  Warm water bath.  Hot water bottle.  Electric heating pad.  Heated gel pack.  Heated wrap.  Electric heating pad. GENERAL HEAT THERAPY RECOMMENDATIONS   Do not sleep while using heat therapy. Only use heat therapy while you are awake.  Your skin may turn pink while using heat therapy. Do not use heat therapy if your skin turns red.  Do not use heat therapy if you have new pain.  High heat or long exposure to heat can cause burns. Be careful when using heat therapy to avoid burning your skin.  Do not use heat therapy on areas of your skin that are already irritated, such as with a rash or sunburn. GET HELP IF:   You have blisters, redness, swelling (puffiness), or numbness.  You have new pain.  Your pain is worse. MAKE SURE YOU:  Understand these instructions.  Will watch your condition.  Will get help right away if you are not doing well or get worse.   This information is not intended to replace advice given to you by your health care provider. Make sure you discuss any questions you have with your health care provider.   Document Released: 04/23/2011 Document Revised: 02/19/2014 Document Reviewed: 03/24/2013 Elsevier Interactive Patient Education [employer]

## 2015-07-16 NOTE — ED Provider Notes (Signed)
CSN: [id number]     Arrival date & time 07/16/15  0941 History   First MD Initiated Contact with Patient 07/16/15 1007     Chief Complaint  Patient presents with  . multiple complaints      (Consider location/radiation/quality/duration/timing/severity/associated sxs/prior Treatment) HPI Comments: Katherine [name] is a 50 y.o. female with a PMHx of CHF, CAD, MI, HTN, asthma, and bronchitis, with a PSHx of hysterectomy, who presents to the ED with multiple complaints. Her primary complaint is right arm pain 3 days. She describes the pain is 10/10 constant sharp squeezing pain in the right arm specifically the right wrist, right upper arm and axilla, and right shoulder/back region, nonradiating from those areas, worse with movement of her arm, unrelieved with ibuprofen and Flexeril, and somewhat improved with heat. She states she is right-handed and she does a lot of computer activities, but denies trauma or injury to her arm. She also reports that she's had some slurred speech and feels like the right side of her face is swollen, but when questioned whether this is going on today she notes that it seems to of resolved can't recall specifics of when it occurred or how often it has been occurring. She mentions that she has needed more pillows to prop herself up has of the pain in her arm/shoulder, but denies orthopnea. She told the nurse she was having CP, but she denies this to me. Denies SOB or dyspnea, states that the pain sometimes causes her to hold her breath but doesn't feel like it's causing her not to be able to breath well.   She denies any fevers, chills, chest pain, shortness breath, lightheadedness, diaphoresis, leg swelling, recent travel/surgery/immobilization, history of DVT/PE, abdominal pain, nausea, vomiting, diarrhea, constipation, dysuria, hematuria, arthralgias, rashes, numbness, tingling, or focal weakness. She is a nonsmoker.  The history is provided by the patient and medical records. No  language interpreter was used.    Past Medical History  Diagnosis Date  . Asthma   . CHF (congestive heart failure) (HCC)   . Coronary artery disease     MI 2007  . Asthma   . Bronchitis   . Hypertension    Past Surgical History  Procedure Laterality Date  . Abdominal hysterectomy    . Tubal ligation    . Hernia repair    . Tubal ligation      R only   Family History  Problem Relation Age of Onset  . Coronary artery disease     Social History  Substance Use Topics  . Smoking status: Current Every Day Smoker    Types: Cigarettes  . Smokeless tobacco: None  . Alcohol Use: Yes     Comment: occasionally   OB History    No data available     Review of Systems  Constitutional: Negative for fever, chills and diaphoresis.  HENT: Positive for facial swelling (she thinks her R face is swollen, but this has resolved).   Respiratory: Negative for shortness of breath.   Cardiovascular: Negative for chest pain and leg swelling.  Gastrointestinal: Negative for nausea, vomiting, abdominal pain, diarrhea and constipation.  Genitourinary: Negative for dysuria and hematuria.  Musculoskeletal: Positive for myalgias (R arm/back). Negative for arthralgias.  Skin: Negative for color change.  Allergic/Immunologic: Negative for immunocompromised state.  Neurological: Positive for speech difficulty (states sometimes her speech is slurred but this has resolved today). Negative for weakness, light-headedness and numbness.  Psychiatric/Behavioral: Negative for confusion.   10 Systems reviewed and are  negative for acute change except as noted in the HPI.    Allergies  Review of patient's allergies indicates no known allergies.  Home Medications   Prior to Admission medications   Medication Sig Start Date End Date Taking? Authorizing Provider  albuterol (PROVENTIL HFA;VENTOLIN HFA) 108 (90 BASE) MCG/ACT inhaler Inhale 2 puffs into the lungs every 6 (six) hours as needed for wheezing.  06/12/13   Rodolph Bong, MD  amLODipine (NORVASC) 5 MG tablet Take 1 tablet (5 mg total) by mouth daily. 04/16/15   Emi Holes, PA-C  aspirin EC 81 MG tablet Take 1 tablet (81 mg total) by mouth daily. 12/08/11   Osvaldo Shipper, MD  cephALEXin (KEFLEX) 500 MG capsule Take 1 capsule (500 mg total) by mouth 4 (four) times daily. Patient not taking: Reported on 11/18/2014 10/29/14   Hayden Rasmussen, NP  cyclobenzaprine (FLEXERIL) 10 MG tablet Take 1 tablet (10 mg total) by mouth 2 (two) times daily as needed for muscle spasms. Patient not taking: Reported on 11/18/2014 12/28/13   Roxy Horseman, PA-C  diazepam (VALIUM) 2 MG tablet Take 1 tablet (2 mg total) by mouth every 4 (four) hours as needed for muscle spasms. 11/18/14   Lorre Nick, MD  fluconazole (DIFLUCAN) 150 MG tablet 1 tab po x 1. May repeat in 72 hours if no improvement Patient not taking: Reported on 11/18/2014 10/29/14   Hayden Rasmussen, NP  HYDROcodone-acetaminophen (NORCO/VICODIN) 5-325 MG per tablet Take 1-2 tablets by mouth every 6 (six) hours as needed for moderate pain. Patient not taking: Reported on 11/18/2014 12/29/13   Vanetta Mulders, MD  ibuprofen (ADVIL,MOTRIN) 600 MG tablet Take 1 tablet (600 mg total) by mouth every 6 (six) hours as needed. 12/28/13   Roxy Horseman, PA-C  LORazepam (ATIVAN) 1 MG tablet Take 1 tablet (1 mg total) by mouth 3 (three) times daily as needed for anxiety. Patient not taking: Reported on 11/18/2014 06/28/14   Francee Piccolo, PA-C  methocarbamol (ROBAXIN) 500 MG tablet Take 1 tablet (500 mg total) by mouth 2 (two) times daily. Patient not taking: Reported on 11/18/2014 10/25/13   Rolland Porter, MD   BP 133/86 mmHg  Pulse 103  Temp(Src) 99.4 F (37.4 C) (Oral)  Resp 18  SpO2 100%  LMP 11/05/2012 Physical Exam  Constitutional: She is oriented to person, place, and time. Vital signs are normal. She appears well-developed and well-nourished.  Non-toxic appearance. No distress.  Afebrile, nontoxic, NAD    HENT:  Head: Normocephalic and atraumatic.  Mouth/Throat: Oropharynx is clear and moist and mucous membranes are normal.  No facial droop or swelling  Eyes: Conjunctivae and EOM are normal. Pupils are equal, round, and reactive to light. Right eye exhibits no discharge. Left eye exhibits no discharge.  PERRL, EOMI, no nystagmus, no visual field deficits   Neck: Normal range of motion. Neck supple. No spinous process tenderness present. No rigidity. Normal range of motion present.  FROM intact without spinous process TTP, no bony stepoffs or deformities. No rigidity or meningeal signs. No bruising or swelling.   Cardiovascular: Normal rate, regular rhythm, normal heart sounds and intact distal pulses.  Exam reveals no gallop and no friction rub.   No murmur heard. HR 95-100 during exam, RRR, nl s1/s2, no m/r/g, distal pulses intact, no pedal edema   Pulmonary/Chest: Effort normal and breath sounds normal. No respiratory distress. She has no decreased breath sounds. She has no wheezes. She has no rhonchi. She has no rales.  Abdominal: Soft. Normal  appearance and bowel sounds are normal. She exhibits no distension. There is no tenderness. There is no rigidity, no rebound, no guarding, no CVA tenderness, no tenderness at McBurney's point and negative Murphy's sign.  Musculoskeletal: Normal range of motion.       Right shoulder: She exhibits tenderness and spasm. She exhibits normal range of motion, no bony tenderness, no swelling, no effusion, no crepitus, no deformity, normal pulse and normal strength.       Arms: R shoulder with ROM intact, no bony TTP, with trapezius and diffuse muscular TTP and spasms noted around the musculature surrounding the scapular border, no swelling/effusion, no bruising or erythema, no warmth, no crepitus/deformity. Diffuse muscular tenderness extending into the remainder of the arm. Strength and sensation grossly intact in all extremities, distal pulses intact.   Compartments soft. Gait steady  Neurological: She is alert and oriented to person, place, and time. She has normal strength. No cranial nerve deficit or sensory deficit. Coordination and gait normal. GCS eye subscore is 4. GCS verbal subscore is 5. GCS motor subscore is 6.  CN 2-12 grossly intact A&O x4 GCS 15 Sensation and strength intact Gait nonataxic Coordination WNL Speech clear  Skin: Skin is warm, dry and intact. No rash noted.  Psychiatric: She has a normal mood and affect. Her speech is normal.  Speech clear  Nursing note and vitals reviewed.   ED Course  Procedures (including critical care time) Labs Review Labs Reviewed - No data to display  Imaging Review No results found.   Echo 11/2011: Study Conclusions - Left ventricle: The cavity size was normal. Wall thickness was normal. Systolic function was normal. The estimated ejection fraction was in the range of 55% to 60%. Wall motion was normal; there were no regional wall motion abnormalities. - Atrial septum: No defect or patent foramen ovale was identified. Transthoracic echocardiography. M-mode, complete 2D, spectral Doppler, and color Doppler. Height: Height: 167.6cm. Height: 66in. Weight: Weight: 76.8kg. Weight: 169lb. Body mass index: BMI: 27.3kg/m^2. Body surface area:  BSA: 1.4090m^2. Blood pressure:   122/84. Patient status: Inpatient. Location: Bedside.  I have personally reviewed and evaluated these images and lab results as part of my medical decision-making.   EKG Interpretation None      MDM   Final diagnoses:  Trapezius strain, right, initial encounter  Trapezius muscle spasm  Back muscle spasm    50 y.o. female here with R arm/back pain/spasms x3 days. Does a lot of computer activities, is right handed. NVI with soft compartments, tenderness over trapezius with palpable muscle spasm, extending along the scapular border. No midline spinal tenderness. She mentions  that she feels like her speech is slurred and R face is swollen, but she has no focal neuro deficits and speech is clear with no evidence of facial swelling, unclear what she's exactly talking about with this complaint, no objective findings of such. She reported CP to the nurse but denied this to me. Denies SOB to me. Overall, shoulder/arm pain is reproducible, no other concerning objective findings on exam, doubt other emergent etiologies of her complaints, this is likely a muscle strain from misuse/overuse, causing spasms. Will give robaxin, naprosyn, and norco here as well as rx to go home with. Discussed f/up with PCP in 1wk. I explained the diagnosis and have given explicit precautions to return to the ER including for any other new or worsening symptoms. The patient understands and accepts the medical plan as it's been dictated and I have answered their questions.  Discharge instructions concerning home care and prescriptions have been given. The patient is STABLE and is discharged to home in good condition.   BP 133/86 mmHg  Pulse 103  Temp(Src) 99.4 F (37.4 C) (Oral)  Resp 18  SpO2 100%  LMP 11/05/2012  Meds ordered this encounter  Medications  . methocarbamol (ROBAXIN) tablet 1,000 mg    Sig:   . HYDROcodone-acetaminophen (NORCO/VICODIN) 5-325 MG per tablet 1 tablet    Sig:   . naproxen (NAPROSYN) tablet 500 mg    Sig:   . methocarbamol (ROBAXIN) 500 MG tablet    Sig: Take 1 tablet (500 mg total) by mouth every 8 (eight) hours as needed for muscle spasms.    Dispense:  15 tablet    Refill:  0    Order Specific Question:  Supervising Provider    Answer:  MILLER, BRIAN [[id number]]  . naproxen (NAPROSYN) 500 MG tablet    Sig: Take 1 tablet (500 mg total) by mouth 2 (two) times daily as needed for mild pain, moderate pain or headache (TAKE WITH MEALS.).    Dispense:  20 tablet    Refill:  0    Order Specific Question:  Supervising Provider    Answer:  MILLER, BRIAN [[id number]]  .  HYDROcodone-acetaminophen (NORCO) 5-325 MG tablet    Sig: Take 1 tablet by mouth every 6 (six) hours as needed for severe pain.    Dispense:  6 tablet    Refill:  0    Order Specific Question:  Supervising Provider    Answer:  Eber Hong [[id number]]     Thana Ramp Camprubi-Soms, PA-C 07/16/15 1049  Melene Plan, DO 07/16/15 1133

## 2015-07-16 NOTE — ED Notes (Signed)
Patient here with 3 days of right sided back pain, right arm pain, axilla pain and left sided facial swelling, denies trauma

## 2015-07-16 NOTE — ED Notes (Addendum)
Rt sided wrist pain thaT RADIATES ALL THE WAY TO BACK and shoulder PT STATES FEELS LIKE CONTRACTIONS in  Back, R SIDE OF Face feels  Swollen and speech she feels is slurred all since Thursday, hurts to breath and sleep and move ,  Denies any injury or  Exertion on that side at all. Pt feels sob . Having rt side cp and she has had a lot of gas, denies n/v

## 2015-08-28 ENCOUNTER — Encounter (HOSPITAL_COMMUNITY): Payer: Self-pay

## 2015-08-28 ENCOUNTER — Emergency Department (HOSPITAL_COMMUNITY): Payer: Medicaid Other

## 2015-08-28 ENCOUNTER — Other Ambulatory Visit: Payer: Self-pay

## 2015-08-28 ENCOUNTER — Emergency Department (HOSPITAL_COMMUNITY)
Admission: EM | Admit: 2015-08-28 | Discharge: 2015-08-28 | Disposition: A | Discharge status: Home / Self Care | Payer: Medicaid Other | Attending: Emergency Medicine | Admitting: Emergency Medicine

## 2015-08-28 DIAGNOSIS — Z79899 Other long term (current) drug therapy: Secondary | ICD-10-CM | POA: Diagnosis not present

## 2015-08-28 DIAGNOSIS — Z87891 Personal history of nicotine dependence: Secondary | ICD-10-CM | POA: Diagnosis not present

## 2015-08-28 DIAGNOSIS — I509 Heart failure, unspecified: Secondary | ICD-10-CM | POA: Insufficient documentation

## 2015-08-28 DIAGNOSIS — I2510 Atherosclerotic heart disease of native coronary artery without angina pectoris: Secondary | ICD-10-CM | POA: Insufficient documentation

## 2015-08-28 DIAGNOSIS — M75102 Unspecified rotator cuff tear or rupture of left shoulder, not specified as traumatic: Secondary | ICD-10-CM | POA: Diagnosis not present

## 2015-08-28 DIAGNOSIS — Z7982 Long term (current) use of aspirin: Secondary | ICD-10-CM | POA: Diagnosis not present

## 2015-08-28 DIAGNOSIS — J45909 Unspecified asthma, uncomplicated: Secondary | ICD-10-CM | POA: Diagnosis not present

## 2015-08-28 DIAGNOSIS — M25519 Pain in unspecified shoulder: Secondary | ICD-10-CM

## 2015-08-28 DIAGNOSIS — R079 Chest pain, unspecified: Secondary | ICD-10-CM | POA: Diagnosis not present

## 2015-08-28 DIAGNOSIS — M25512 Pain in left shoulder: Secondary | ICD-10-CM | POA: Insufficient documentation

## 2015-08-28 DIAGNOSIS — I110 Hypertensive heart disease with heart failure: Secondary | ICD-10-CM | POA: Insufficient documentation

## 2015-08-28 DIAGNOSIS — M7582 Other shoulder lesions, left shoulder: Secondary | ICD-10-CM

## 2015-08-28 LAB — CBC
HEMATOCRIT: 40.6 % (ref 36.0–46.0)
HEMOGLOBIN: 13.1 g/dL (ref 12.0–15.0)
MCH: 30.2 pg (ref 26.0–34.0)
MCHC: 32.3 g/dL (ref 30.0–36.0)
MCV: 93.5 fL (ref 78.0–100.0)
PLATELETS: 309 10*3/uL (ref 150–400)
RBC: 4.34 MIL/uL (ref 3.87–5.11)
RDW: 13.8 % (ref 11.5–15.5)
WBC: 6.5 10*3/uL (ref 4.0–10.5)

## 2015-08-28 LAB — BASIC METABOLIC PANEL
ANION GAP: 8 (ref 5–15)
BUN: 14 mg/dL (ref 6–20)
CHLORIDE: 108 mmol/L (ref 101–111)
CO2: 25 mmol/L (ref 22–32)
CREATININE: 0.74 mg/dL (ref 0.44–1.00)
Calcium: 9 mg/dL (ref 8.9–10.3)
GFR calc non Af Amer: >60 mL/min (ref >60)
Glucose, Bld: 96 mg/dL (ref 65–99)
POTASSIUM: 3.5 mmol/L (ref 3.5–5.1)
SODIUM: 141 mmol/L (ref 135–145)

## 2015-08-28 LAB — I-STAT TROPONIN, ED
TROPONIN I, POC: 0 ng/mL (ref 0.00–0.08)
Troponin i, poc: 0 ng/mL (ref 0.00–0.08)

## 2015-08-28 LAB — SEDIMENTATION RATE: SED RATE: 10 mm/h (ref 0–22)

## 2015-08-28 LAB — C-REACTIVE PROTEIN: CRP: 1 mg/dL — AB (ref <1.0)

## 2015-08-28 MED ORDER — HYDROCODONE-ACETAMINOPHEN 5-325 MG PO TABS: 1.0000 | ORAL_TABLET | Freq: Four times a day (QID) | ORAL | Status: DC | PRN

## 2015-08-28 MED ORDER — MELOXICAM 15 MG PO TABS: 15.0000 mg | ORAL_TABLET | Freq: Every day | ORAL | Status: DC

## 2015-08-28 MED ORDER — HYDROCODONE-ACETAMINOPHEN 5-325 MG PO TABS
2.0000 | ORAL_TABLET | Freq: Once | ORAL | Status: AC
  Administered 2015-08-28: 2 via ORAL
  Filled 2015-08-28: qty 2

## 2015-08-28 MED ORDER — NAPROXEN 250 MG PO TABS
500.0000 mg | ORAL_TABLET | Freq: Once | ORAL | Status: AC
  Administered 2015-08-28: 500 mg via ORAL
  Filled 2015-08-28: qty 2

## 2015-08-28 NOTE — ED Notes (Signed)
Patient here with 3 days of sharp left arm and shoulder pain that is worse with movement. Today developed chest pain that she describes as stabbing

## 2015-08-28 NOTE — ED Provider Notes (Addendum)
CSN: [id number]     Arrival date & time 08/28/15  0910 History   First MD Initiated Contact with Patient 08/28/15 [phone]     Chief Complaint  Patient presents with  . Chest Pain  . left arm pain      (Consider location/radiation/quality/duration/timing/severity/associated sxs/prior Treatment) HPI Comments: Pt comes in with cc of L sided shoulder pain and chest pain. Shoulder pain x 3 days, unprovoked, with some swelling. No hx of trauma. Pt is immunocompetent. Pt is R handed. Pt reports that the pain has climbed up to her chest. Chest pain is described as tightness, with some associated dib. Pain is not pleuritic.   ROS 10 Systems reviewed and are negative for acute change except as noted in the HPI.     The history is provided by the patient.    Past Medical History  Diagnosis Date  . Asthma   . CHF (congestive heart failure) (HCC)   . Coronary artery disease     MI 2007  . Asthma   . Bronchitis   . Hypertension    Past Surgical History  Procedure Laterality Date  . Abdominal hysterectomy    . Tubal ligation    . Hernia repair    . Tubal ligation      R only   Family History  Problem Relation Age of Onset  . Coronary artery disease     Social History  Substance Use Topics  . Smoking status: Former Smoker    Types: Cigarettes  . Smokeless tobacco: None  . Alcohol Use: Yes     Comment: occasionally   OB History    No data available     Review of Systems    Allergies  Review of patient's allergies indicates no known allergies.  Home Medications   Prior to Admission medications   Medication Sig Start Date End Date Taking? Authorizing Provider  albuterol (PROVENTIL HFA;VENTOLIN HFA) 108 (90 BASE) MCG/ACT inhaler Inhale 2 puffs into the lungs every 6 (six) hours as needed for wheezing. 06/12/13  Yes Rodolph Bong, MD  amLODipine (NORVASC) 5 MG tablet Take 1 tablet (5 mg total) by mouth daily. 04/16/15  Yes Alexandra M Law, PA-C  aspirin EC 81 MG tablet Take  1 tablet (81 mg total) by mouth daily. Patient not taking: Reported on 08/28/2015 12/08/11   Osvaldo Shipper, MD  diazepam (VALIUM) 2 MG tablet Take 1 tablet (2 mg total) by mouth every 4 (four) hours as needed for muscle spasms. Patient not taking: Reported on 08/28/2015 11/18/14   Lorre Nick, MD  HYDROcodone-acetaminophen (NORCO/VICODIN) 5-325 MG tablet Take 1 tablet by mouth every 6 (six) hours as needed. 08/28/15   Derwood Kaplan, MD  meloxicam (MOBIC) 15 MG tablet Take 1 tablet (15 mg total) by mouth daily. 08/28/15   Derwood Kaplan, MD  methocarbamol (ROBAXIN) 500 MG tablet Take 1 tablet (500 mg total) by mouth every 8 (eight) hours as needed for muscle spasms. Patient not taking: Reported on 08/28/2015 07/16/15   Mercedes Camprubi-Soms, PA-C  naproxen (NAPROSYN) 500 MG tablet Take 1 tablet (500 mg total) by mouth 2 (two) times daily as needed for mild pain, moderate pain or headache (TAKE WITH MEALS.). Patient not taking: Reported on 08/28/2015 07/16/15   Mercedes Camprubi-Soms, PA-C   BP 113/90 mmHg  Pulse 102  Temp(Src) 98.4 F (36.9 C) (Oral)  Resp 17  SpO2 100%  LMP 11/05/2012 Physical Exam  Constitutional: She is oriented to person, place, and time. She appears  well-developed.  HENT:  Head: Normocephalic and atraumatic.  Eyes: Conjunctivae and EOM are normal. Pupils are equal, round, and reactive to light.  Neck: Normal range of motion. Neck supple.  Cardiovascular: Normal rate, regular rhythm and normal heart sounds.   Pulmonary/Chest: Effort normal and breath sounds normal. No respiratory distress.  Abdominal: Soft. Bowel sounds are normal. She exhibits no distension. There is no tenderness. There is no rebound and no guarding.  Musculoskeletal:  Reproducible tenderness to the L shoudler with palpation. Mild edema noted to the shoulder, no warmth to touch or erythema. Tenderness worse with abduction of the upper extremity.  Neurological: She is alert and oriented to person, place,  and time.  Skin: Skin is warm and dry.  Nursing note and vitals reviewed.   ED Course  Procedures (including critical care time) Labs Review Labs Reviewed  C-REACTIVE PROTEIN - Abnormal; Notable for the following:    CRP 1.0 (*)    All other components within normal limits  BASIC METABOLIC PANEL  CBC  SEDIMENTATION RATE  I-STAT TROPOININ, ED  Rosezena SensorI-STAT TROPOININ, ED    Imaging Review Dg Chest 2 View  08/28/2015  CLINICAL DATA:  Sharp pain in left arm.  Left upper chest pain. EXAM: CHEST  2 VIEW COMPARISON:  05/14/2014 FINDINGS: The heart size and mediastinal contours are within normal limits. Scar versus platelike atelectasis noted. The visualized skeletal structures are unremarkable. IMPRESSION: Left base scar versus atelectasis. Electronically Signed   By: Signa Kellaylor  Stroud M.D.   On: 08/28/2015 09:56   Dg Shoulder Left  08/28/2015  CLINICAL DATA:  Acute left shoulder pain. EXAM: LEFT SHOULDER - 2+ VIEW COMPARISON:  None. FINDINGS: Calcification is noted along the rotator cuff compatible calcific tendinitis. The shoulder is located. The humerus is intact. [facility] is normal. Linear densities are present at the left lung base, likely reflecting atelectasis. IMPRESSION: 1. Calcifications and a greater tubercle likely reflect calcific tendinitis. Loose bodies are considered less likely. 2. No acute abnormality. 3. Densities at the left lung base likely reflect atelectasis. Electronically Signed   By: Marin Robertshristopher  Mattern M.D.   On: 08/28/2015 11:00   I have personally reviewed and evaluated these images and lab results as part of my medical decision-making.   EKG Interpretation   Date/Time:  Sunday August 28 2015 09:14:05 EDT Ventricular Rate:  112 PR Interval:  174 QRS Duration: 92 QT Interval:  342 QTC Calculation: 466 R Axis:   34 Text Interpretation:  Sinus tachycardia ST \\T \ T wave abnormality,  consider anterior ischemia Abnormal ECG anterior ST elevatin with inferior   depression are new Confirmed by Rhunette CroftNANAVATI, MD, Peightyn Roberson [phone]([id number]) on 08/28/2015  9:44:09 AM    EKG Interpretation  Date/Time:  Sunday August 28 2015 09:53:03 EDT Ventricular Rate:  107 PR Interval:  174 QRS Duration: 89 QT Interval:  351 QTC Calculation: 469 R Axis:   31 Text Interpretation:  Sinus tachycardia Probable anteroseptal infarct, recent Lateral leads are also involved unchanged nonspecific ST changes in the anteiror leads Confirmed by Carrye Goller, MD, Janey GentaANKIT [phone]([id number]) on 08/28/2015 12:36:03 PM        MDM   Final diagnoses:  Shoulder pain, acute  Rotator cuff tendinitis, left    Pt comes in with L sided chest discomfort and shoulder pain. Her shoulder pain is reproducible with palpation of the anterior and the posterior shoulder region. There is mild swelling, but no redness, warmth. We dont think pt has septic arthritis. Symptoms appear more consistent with tendinitis (  possibly rotator cuff based on the exam). Will still get sed rate and crp.  She will also get trops x 2 due to her chest pain that started today that could be just due to the shoulder, but might be separate issues. HEAR score is 1 - for age. Pt has no hx of PE, DVT and denies any exogenous estrogen use, long distance travels or surgery in the past 6 weeks, active cancer, recent immobilization.      Derwood Kaplan, MD 08/28/15 1239  Derwood Kaplan, MD 08/28/15 1240  Derwood Kaplan, MD 08/28/15 1324

## 2015-08-28 NOTE — Discharge Instructions (Signed)
Please see the Orthopedics doctor for further evaluation. There is a concern for rotator cuff tendinitis. Take the medicine as prescribed.  Please return to the ER if you have worsening chest pain, shortness of breath, pain radiating to your jaw, shoulder, or back, sweats or fainting. Otherwise see the Cardiologist or your primary care doctor as requested.    Cryotherapy Cryotherapy means treatment with cold. Ice or gel packs can be used to reduce both pain and swelling. Ice is the most helpful within the first 24 to 48 hours after an injury or flare-up from overusing a muscle or joint. Sprains, strains, spasms, burning pain, shooting pain, and aches can all be eased with ice. Ice can also be used when recovering from surgery. Ice is effective, has very few side effects, and is safe for most people to use. PRECAUTIONS  Ice is not a safe treatment option for people with:  Raynaud phenomenon. This is a condition affecting small blood vessels in the extremities. Exposure to cold may cause your problems to return.  Cold hypersensitivity. There are many forms of cold hypersensitivity, including:  Cold urticaria. Red, itchy hives appear on the skin when the tissues begin to warm after being iced.  Cold erythema. This is a red, itchy rash caused by exposure to cold.  Cold hemoglobinuria. Red blood cells break down when the tissues begin to warm after being iced. The hemoglobin that carry oxygen are passed into the urine because they cannot combine with blood proteins fast enough.  Numbness or altered sensitivity in the area being iced. If you have any of the following conditions, do not use ice until you have discussed cryotherapy with your caregiver:  Heart conditions, such as arrhythmia, angina, or chronic heart disease.  High blood pressure.  Healing wounds or open skin in the area being iced.  Current infections.  Rheumatoid arthritis.  Poor circulation.  Diabetes. Ice slows the  blood flow in the region it is applied. This is beneficial when trying to stop inflamed tissues from spreading irritating chemicals to surrounding tissues. However, if you expose your skin to cold temperatures for too long or without the proper protection, you can damage your skin or nerves. Watch for signs of skin damage due to cold. HOME CARE INSTRUCTIONS Follow these tips to use ice and cold packs safely.  Place a dry or damp towel between the ice and skin. A damp towel will cool the skin more quickly, so you may need to shorten the time that the ice is used.  For a more rapid response, add gentle compression to the ice.  Ice for no more than 10 to 20 minutes at a time. The bonier the area you are icing, the less time it will take to get the benefits of ice.  Check your skin after 5 minutes to make sure there are no signs of a poor response to cold or skin damage.  Rest 20 minutes or more between uses.  Once your skin is numb, you can end your treatment. You can test numbness by very lightly touching your skin. The touch should be so light that you do not see the skin dimple from the pressure of your fingertip. When using ice, most people will feel these normal sensations in this order: cold, burning, aching, and numbness.  Do not use ice on someone who cannot communicate their responses to pain, such as small children or people with dementia. HOW TO MAKE AN ICE PACK Ice packs are the most  common way to use ice therapy. Other methods include ice massage, ice baths, and cryosprays. Muscle creams that cause a cold, tingly feeling do not offer the same benefits that ice offers and should not be used as a substitute unless recommended by your caregiver. To make an ice pack, do one of the following:  Place crushed ice or a bag of frozen vegetables in a sealable plastic bag. Squeeze out the excess air. Place this bag inside another plastic bag. Slide the bag into a pillowcase or place a damp towel  between your skin and the bag.  Mix 3 parts water with 1 part rubbing alcohol. Freeze the mixture in a sealable plastic bag. When you remove the mixture from the freezer, it will be slushy. Squeeze out the excess air. Place this bag inside another plastic bag. Slide the bag into a pillowcase or place a damp towel between your skin and the bag. SEEK MEDICAL CARE IF:  You develop white spots on your skin. This may give the skin a blotchy (mottled) appearance.  Your skin turns blue or pale.  Your skin becomes waxy or hard.  Your swelling gets worse. MAKE SURE YOU:   Understand these instructions.  Will watch your condition.  Will get help right away if you are not doing well or get worse.   This information is not intended to replace advice given to you by your health care provider. Make sure you discuss any questions you have with your health care provider.   Document Released: 09/25/2010 Document Revised: 02/19/2014 Document Reviewed: 09/25/2010 Elsevier Interactive Patient Education 2016 [employer]  Musculoskeletal Pain Musculoskeletal pain is muscle and boney aches and pains. These pains can occur in any part of the body. Your caregiver may treat you without knowing the cause of the pain. They may treat you if blood or urine tests, X-rays, and other tests were normal.  CAUSES There is often not a definite cause or reason for these pains. These pains may be caused by a type of germ (virus). The discomfort may also come from overuse. Overuse includes working out too hard when your body is not fit. Boney aches also come from weather changes. Bone is sensitive to atmospheric pressure changes. HOME CARE INSTRUCTIONS   Ask when your test results will be ready. Make sure you get your test results.  Only take over-the-counter or prescription medicines for pain, discomfort, or fever as directed by your caregiver. If you were given medications for your condition, do not drive, operate  machinery or power tools, or sign legal documents for 24 hours. Do not drink alcohol. Do not take sleeping pills or other medications that may interfere with treatment.  Continue all activities unless the activities cause more pain. When the pain lessens, slowly resume normal activities. Gradually increase the intensity and duration of the activities or exercise.  During periods of severe pain, bed rest may be helpful. Lay or sit in any position that is comfortable.  Putting ice on the injured area.  Put ice in a bag.  Place a towel between your skin and the bag.  Leave the ice on for 15 to 20 minutes, 3 to 4 times a day.  Follow up with your caregiver for continued problems and no reason can be found for the pain. If the pain becomes worse or does not go away, it may be necessary to repeat tests or do additional testing. Your caregiver may need to look further for a possible cause. SEEK  IMMEDIATE MEDICAL CARE IF:  You have pain that is getting worse and is not relieved by medications.  You develop chest pain that is associated with shortness or breath, sweating, feeling sick to your stomach (nauseous), or throw up (vomit).  Your pain becomes localized to the abdomen.  You develop any new symptoms that seem different or that concern you. MAKE SURE YOU:   Understand these instructions.  Will watch your condition.  Will get help right away if you are not doing well or get worse.   This information is not intended to replace advice given to you by your health care provider. Make sure you discuss any questions you have with your health care provider.   Document Released: 01/29/2005 Document Revised: 04/23/2011 Document Reviewed: 10/03/2012 Elsevier Interactive Patient Education 2016 [employer] RICE for Routine Care of Injuries Theroutine careofmanyinjuriesincludes rest, ice, compression, and elevation (RICE therapy). RICE therapy is often recommended for injuries to soft  tissues, such as a muscle strain, ligament injuries, bruises, and overuse injuries. It can also be used for some bony injuries. Using RICE therapy can help to relieve pain, lessen swelling, and enable your body to heal. Rest Rest is required to allow your body to heal. This usually involves reducing your normal activities and avoiding use of the injured part of your body. Generally, you can return to your normal activities when you are comfortable and have been given permission by your health care provider. Ice Icing your injury helps to keep the swelling down, and it lessens pain. Do not apply ice directly to your skin.  Put ice in a plastic bag.  Place a towel between your skin and the bag.  Leave the ice on for 20 minutes, 2-3 times a day. Do this for as long as you are directed by your health care provider. Compression Compression means putting pressure on the injured area. Compression helps to keep swelling down, gives support, and helps with discomfort. Compression may be done with an elastic bandage. If an elastic bandage has been applied, follow these general tips:  Remove and reapply the bandage every 3-4 hours or as directed by your health care provider.  Make sure the bandage is not wrapped too tightly, because this can cut off circulation. If part of your body beyond the bandage becomes blue, numb, cold, swollen, or more painful, your bandage is most likely too tight. If this occurs, remove your bandage and reapply it more loosely.  See your health care provider if the bandage seems to be making your problems worse rather than better. Elevation Elevation means keeping the injured area raised. This helps to lessen swelling and decrease pain. If possible, your injured area should be elevated at or above the level of your heart or the center of your chest. WHEN SHOULD I SEEK MEDICAL CARE? You should seek medical care if:  Your pain and swelling continue.  Your symptoms are getting  worse rather than improving. These symptoms may indicate that further evaluation or further X-rays are needed. Sometimes, X-rays may not show a small broken bone (fracture) until a number of days later. Make a follow-up appointment with your health care provider. WHEN SHOULD I SEEK IMMEDIATE MEDICAL CARE? You should seek immediate medical care if:  You have sudden severe pain at or below the area of your injury.  You have redness or increased swelling around your injury.  You have tingling or numbness at or below the area of your injury that does not improve  after you remove the elastic bandage.   This information is not intended to replace advice given to you by your health care provider. Make sure you discuss any questions you have with your health care provider.   Document Released: 05/13/2000 Document Revised: 10/20/2014 Document Reviewed: 01/06/2014 Elsevier Interactive Patient Education [employer]

## 2016-03-21 ENCOUNTER — Ambulatory Visit (INDEPENDENT_AMBULATORY_CARE_PROVIDER_SITE_OTHER): Payer: Self-pay | Admitting: Orthopaedic Surgery

## 2016-05-10 ENCOUNTER — Emergency Department (HOSPITAL_COMMUNITY): Payer: Medicaid Other

## 2016-05-10 ENCOUNTER — Encounter (HOSPITAL_COMMUNITY): Payer: Self-pay | Admitting: *Deleted

## 2016-05-10 ENCOUNTER — Emergency Department (HOSPITAL_COMMUNITY)
Admission: EM | Admit: 2016-05-10 | Discharge: 2016-05-10 | Disposition: A | Discharge status: Home / Self Care | Payer: Medicaid Other | Attending: Emergency Medicine | Admitting: Emergency Medicine

## 2016-05-10 DIAGNOSIS — J45909 Unspecified asthma, uncomplicated: Secondary | ICD-10-CM | POA: Diagnosis not present

## 2016-05-10 DIAGNOSIS — W0110XA Fall on same level from slipping, tripping and stumbling with subsequent striking against unspecified object, initial encounter: Secondary | ICD-10-CM | POA: Diagnosis not present

## 2016-05-10 DIAGNOSIS — M542 Cervicalgia: Secondary | ICD-10-CM | POA: Diagnosis not present

## 2016-05-10 DIAGNOSIS — W19XXXA Unspecified fall, initial encounter: Secondary | ICD-10-CM

## 2016-05-10 DIAGNOSIS — S60211A Contusion of right wrist, initial encounter: Secondary | ICD-10-CM | POA: Diagnosis not present

## 2016-05-10 DIAGNOSIS — Y929 Unspecified place or not applicable: Secondary | ICD-10-CM | POA: Insufficient documentation

## 2016-05-10 DIAGNOSIS — I509 Heart failure, unspecified: Secondary | ICD-10-CM | POA: Diagnosis not present

## 2016-05-10 DIAGNOSIS — M25562 Pain in left knee: Secondary | ICD-10-CM | POA: Diagnosis not present

## 2016-05-10 DIAGNOSIS — S6991XA Unspecified injury of right wrist, hand and finger(s), initial encounter: Secondary | ICD-10-CM | POA: Diagnosis present

## 2016-05-10 DIAGNOSIS — I110 Hypertensive heart disease with heart failure: Secondary | ICD-10-CM | POA: Diagnosis not present

## 2016-05-10 DIAGNOSIS — I2510 Atherosclerotic heart disease of native coronary artery without angina pectoris: Secondary | ICD-10-CM | POA: Insufficient documentation

## 2016-05-10 DIAGNOSIS — Y999 Unspecified external cause status: Secondary | ICD-10-CM | POA: Insufficient documentation

## 2016-05-10 DIAGNOSIS — Y939 Activity, unspecified: Secondary | ICD-10-CM | POA: Insufficient documentation

## 2016-05-10 MED ORDER — HYDROCODONE-ACETAMINOPHEN 5-325 MG PO TABS
1.0000 | ORAL_TABLET | Freq: Once | ORAL | Status: AC
  Administered 2016-05-10: 1 via ORAL
  Filled 2016-05-10: qty 1

## 2016-05-10 MED ORDER — METHOCARBAMOL 500 MG PO TABS: 500.0000 mg | ORAL_TABLET | Freq: Two times a day (BID) | ORAL | 0 refills | Status: DC

## 2016-05-10 MED ORDER — IBUPROFEN 800 MG PO TABS: 800.0000 mg | ORAL_TABLET | Freq: Three times a day (TID) | ORAL | 0 refills | Status: DC

## 2016-05-10 NOTE — ED Triage Notes (Signed)
Patient is alert and oriented x4.  She is being seen for a fall from an uneven surface.  Patient states that she trip and fell.  She adds that she had LOC for a moment when she hit her face.  She also complains of Head, Neck, face back and bilateral wrist.  Currently rates her pain 9 of 10

## 2016-05-10 NOTE — ED Provider Notes (Signed)
Pt's xrays returned.  No fractures.  Pt placed in a left thumb spica splint.   Pt given rx for ibuprofen and robaxin.  Pt ambulating without difficulty   Elson AreasLeslie K Arnett Duddy, PA-C 05/10/16 1737    Maia PlanJoshua G Long, MD 05/10/16 Mikle Bosworth1902
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
## 2016-05-10 NOTE — ED Notes (Signed)
Pt refused another set of vitals. She stated that she "had a train to catch."

## 2016-05-10 NOTE — ED Notes (Signed)
Bed: WA08 Expected date:  Expected time:  Means of arrival:  Comments: EMS-fall 

## 2016-07-31 ENCOUNTER — Emergency Department (HOSPITAL_COMMUNITY)
Admission: EM | Admit: 2016-07-31 | Discharge: 2016-08-01 | Disposition: A | Discharge status: Home / Self Care | Payer: Medicaid Other | Attending: Nurse Practitioner | Admitting: Nurse Practitioner

## 2016-07-31 ENCOUNTER — Emergency Department (HOSPITAL_COMMUNITY): Payer: Medicaid Other

## 2016-07-31 ENCOUNTER — Encounter (HOSPITAL_COMMUNITY): Payer: Self-pay | Admitting: Emergency Medicine

## 2016-07-31 DIAGNOSIS — J45909 Unspecified asthma, uncomplicated: Secondary | ICD-10-CM | POA: Insufficient documentation

## 2016-07-31 DIAGNOSIS — I110 Hypertensive heart disease with heart failure: Secondary | ICD-10-CM | POA: Insufficient documentation

## 2016-07-31 DIAGNOSIS — I2510 Atherosclerotic heart disease of native coronary artery without angina pectoris: Secondary | ICD-10-CM | POA: Diagnosis not present

## 2016-07-31 DIAGNOSIS — Z7982 Long term (current) use of aspirin: Secondary | ICD-10-CM | POA: Insufficient documentation

## 2016-07-31 DIAGNOSIS — R509 Fever, unspecified: Secondary | ICD-10-CM | POA: Diagnosis present

## 2016-07-31 DIAGNOSIS — F17210 Nicotine dependence, cigarettes, uncomplicated: Secondary | ICD-10-CM | POA: Insufficient documentation

## 2016-07-31 DIAGNOSIS — N12 Tubulo-interstitial nephritis, not specified as acute or chronic: Secondary | ICD-10-CM

## 2016-07-31 DIAGNOSIS — N10 Acute tubulo-interstitial nephritis: Secondary | ICD-10-CM | POA: Insufficient documentation

## 2016-07-31 DIAGNOSIS — I509 Heart failure, unspecified: Secondary | ICD-10-CM | POA: Insufficient documentation

## 2016-07-31 DIAGNOSIS — Z7951 Long term (current) use of inhaled steroids: Secondary | ICD-10-CM | POA: Insufficient documentation

## 2016-07-31 LAB — COMPREHENSIVE METABOLIC PANEL
ALBUMIN: 3.7 g/dL (ref 3.5–5.0)
ALK PHOS: 84 U/L (ref 38–126)
ALT: 24 U/L (ref 14–54)
AST: 28 U/L (ref 15–41)
Anion gap: 11 (ref 5–15)
BILIRUBIN TOTAL: 1.2 mg/dL (ref 0.3–1.2)
BUN: 9 mg/dL (ref 6–20)
CALCIUM: 9 mg/dL (ref 8.9–10.3)
CO2: 26 mmol/L (ref 22–32)
CREATININE: 0.72 mg/dL (ref 0.44–1.00)
Chloride: 101 mmol/L (ref 101–111)
GFR calc Af Amer: >60 mL/min (ref >60)
GFR calc non Af Amer: >60 mL/min (ref >60)
Glucose, Bld: 106 mg/dL — ABNORMAL HIGH (ref 65–99)
Potassium: 3.5 mmol/L (ref 3.5–5.1)
SODIUM: 138 mmol/L (ref 135–145)
TOTAL PROTEIN: 7.3 g/dL (ref 6.5–8.1)

## 2016-07-31 LAB — URINALYSIS, ROUTINE W REFLEX MICROSCOPIC
Bilirubin Urine: NEGATIVE
Glucose, UA: NEGATIVE mg/dL
Ketones, ur: NEGATIVE mg/dL
NITRITE: POSITIVE — AB
PH: 5 (ref 5.0–8.0)
Protein, ur: >=300 mg/dL — AB
SPECIFIC GRAVITY, URINE: 1.02 (ref 1.005–1.030)
Squamous Epithelial / LPF: NONE SEEN

## 2016-07-31 LAB — CBC WITH DIFFERENTIAL/PLATELET
BASOS ABS: 0 10*3/uL (ref 0.0–0.1)
BASOS PCT: 0 %
EOS PCT: 0 %
Eosinophils Absolute: 0 10*3/uL (ref 0.0–0.7)
HEMATOCRIT: 37.7 % (ref 36.0–46.0)
Hemoglobin: 12.7 g/dL (ref 12.0–15.0)
Lymphocytes Relative: 19 %
Lymphs Abs: 1.6 10*3/uL (ref 0.7–4.0)
MCH: 30.6 pg (ref 26.0–34.0)
MCHC: 33.7 g/dL (ref 30.0–36.0)
MCV: 90.8 fL (ref 78.0–100.0)
MONO ABS: 0.7 10*3/uL (ref 0.1–1.0)
MONOS PCT: 8 %
Neutro Abs: 6 10*3/uL (ref 1.7–7.7)
Neutrophils Relative %: 73 %
PLATELETS: 264 10*3/uL (ref 150–400)
RBC: 4.15 MIL/uL (ref 3.87–5.11)
RDW: 13.6 % (ref 11.5–15.5)
WBC: 8.3 10*3/uL (ref 4.0–10.5)

## 2016-07-31 LAB — CG4 I-STAT (LACTIC ACID): LACTIC ACID, VENOUS: 0.7 mmol/L (ref 0.5–1.9)

## 2016-07-31 MED ORDER — DEXTROSE 5 % IV SOLN
1.0000 g | Freq: Once | INTRAVENOUS | Status: AC
  Administered 2016-07-31: 1 g via INTRAVENOUS
  Filled 2016-07-31: qty 10

## 2016-07-31 MED ORDER — ACETAMINOPHEN 500 MG PO TABS
1000.0000 mg | ORAL_TABLET | Freq: Once | ORAL | Status: AC
  Administered 2016-07-31: 1000 mg via ORAL
  Filled 2016-07-31: qty 2

## 2016-07-31 MED ORDER — SODIUM CHLORIDE 0.9 % IV BOLUS (SEPSIS)
1000.0000 mL | Freq: Once | INTRAVENOUS | Status: AC
  Administered 2016-07-31: 1000 mL via INTRAVENOUS

## 2016-07-31 NOTE — ED Provider Notes (Signed)
Complains of low abdominal pain rating to right flank for one week. No nausea or vomiting. Admits to fever. No other associated symptoms pain is mild at present. Patient appears in no distress   Katherine [name], Katherine Maresh, MD 07/31/16 2306
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
## 2016-07-31 NOTE — ED Triage Notes (Signed)
Pt is c/o strong smelling urine, right flank pain, right arm pain, headache, cold chills, decreased appetite  Onset of sxs was Monday  Pt states she has taken bayer baby aspirin  Last dose was at 1330 today

## 2016-08-01 MED ORDER — CEPHALEXIN 500 MG PO CAPS: 500.0000 mg | ORAL_CAPSULE | Freq: Four times a day (QID) | ORAL | 0 refills | Status: DC

## 2016-08-01 NOTE — Discharge Instructions (Signed)
Stay very well hydrated with plenty of water throughout the day. Please take antibiotic until completion. Alternate between Tylenol and Ibuprofen as needed for fevers. Follow up with primary care physician in 1 week for recheck of ongoing symptoms.  Please seek immediate care if you develop the following: Your symptoms are no better in 2-3 days. There is severe back pain or lower abdominal pain.  There is nausea or vomiting.  There is continued burning or discomfort with urination.  New or worsening symptoms develop. You have any additional concerns.

## 2016-08-03 LAB — URINE CULTURE: Culture: >=100000 — AB

## 2016-08-05 ENCOUNTER — Telehealth: Payer: Self-pay

## 2016-08-05 NOTE — Telephone Encounter (Signed)
Post ED Visit - Positive Culture Follow-up  Culture report reviewed by antimicrobial stewardship pharmacist:  []  Katherine [name], Pharm.D. []  Katherine [name], Pharm.D., BCPS AQ-ID []  Katherine [name], Pharm.D., BCPS []  Katherine [name], [address] BCPS []  Katherine [name], [address] BCPS, AAHIVP []  Katherine [name], Pharm.D., BCPS, AAHIVP []  Katherine [name], PharmD, BCPS []  Katherine [name], PharmD, BCPS []  Katherine [name], PharmD, BCPS [facility] Positive urine culture Treated with Cephalexin, organism sensitive to the same and no further patient follow-up is required at this time.  Katherine [name], Katherine [name] 08/05/2016, 1:18 PM

## 2017-07-06 ENCOUNTER — Encounter (HOSPITAL_COMMUNITY): Payer: Self-pay | Admitting: Emergency Medicine

## 2017-07-06 ENCOUNTER — Emergency Department (HOSPITAL_COMMUNITY)
Admission: EM | Admit: 2017-07-06 | Discharge: 2017-07-06 | Disposition: A | Discharge status: Home / Self Care | Payer: Medicaid Other | Attending: Emergency Medicine | Admitting: Emergency Medicine

## 2017-07-06 DIAGNOSIS — Y999 Unspecified external cause status: Secondary | ICD-10-CM | POA: Insufficient documentation

## 2017-07-06 DIAGNOSIS — Z79899 Other long term (current) drug therapy: Secondary | ICD-10-CM | POA: Insufficient documentation

## 2017-07-06 DIAGNOSIS — Z720 Tobacco use: Secondary | ICD-10-CM | POA: Diagnosis not present

## 2017-07-06 DIAGNOSIS — S0181XA Laceration without foreign body of other part of head, initial encounter: Secondary | ICD-10-CM | POA: Insufficient documentation

## 2017-07-06 DIAGNOSIS — I509 Heart failure, unspecified: Secondary | ICD-10-CM | POA: Insufficient documentation

## 2017-07-06 DIAGNOSIS — Y9289 Other specified places as the place of occurrence of the external cause: Secondary | ICD-10-CM | POA: Insufficient documentation

## 2017-07-06 DIAGNOSIS — J45909 Unspecified asthma, uncomplicated: Secondary | ICD-10-CM | POA: Insufficient documentation

## 2017-07-06 DIAGNOSIS — I110 Hypertensive heart disease with heart failure: Secondary | ICD-10-CM | POA: Diagnosis not present

## 2017-07-06 DIAGNOSIS — Y9389 Activity, other specified: Secondary | ICD-10-CM | POA: Diagnosis not present

## 2017-07-06 DIAGNOSIS — S0993XA Unspecified injury of face, initial encounter: Secondary | ICD-10-CM | POA: Diagnosis present

## 2017-07-06 DIAGNOSIS — M542 Cervicalgia: Secondary | ICD-10-CM

## 2017-07-06 MED ORDER — CHLORHEXIDINE GLUCONATE 0.12 % MT SOLN: 15.0000 mL | Freq: Two times a day (BID) | OROMUCOSAL | 0 refills | Status: DC

## 2017-07-06 MED ORDER — IBUPROFEN 800 MG PO TABS: 800.0000 mg | ORAL_TABLET | Freq: Three times a day (TID) | ORAL | 0 refills | Status: AC

## 2017-07-06 MED ORDER — AMOXICILLIN-POT CLAVULANATE 875-125 MG PO TABS: 1.0000 | ORAL_TABLET | Freq: Two times a day (BID) | ORAL | 0 refills | Status: DC

## 2017-07-06 MED ORDER — AMOXICILLIN-POT CLAVULANATE 875-125 MG PO TABS
1.0000 | ORAL_TABLET | Freq: Once | ORAL | Status: AC
  Administered 2017-07-06: 1 via ORAL
  Filled 2017-07-06: qty 1

## 2017-07-06 MED ORDER — LIDOCAINE HCL (PF) 1 % IJ SOLN
5.0000 mL | Freq: Once | INTRAMUSCULAR | Status: AC
  Administered 2017-07-06: 5 mL
  Filled 2017-07-06: qty 5

## 2017-07-06 MED ORDER — HYDROCODONE-ACETAMINOPHEN 5-325 MG PO TABS
2.0000 | ORAL_TABLET | Freq: Once | ORAL | Status: AC
  Administered 2017-07-06: 2 via ORAL
  Filled 2017-07-06: qty 2

## 2017-07-06 MED ORDER — IBUPROFEN 400 MG PO TABS
600.0000 mg | ORAL_TABLET | Freq: Once | ORAL | Status: AC
  Administered 2017-07-06: 600 mg via ORAL
  Filled 2017-07-06: qty 1
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
## 2017-07-06 NOTE — ED Notes (Signed)
Patient Alert and oriented to baseline. Stable and ambulatory to baseline. Patient verbalized understanding of the discharge instructions.  Patient belongings were taken by the patient.   
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
## 2017-07-06 NOTE — Discharge Instructions (Addendum)
By declining CT scans today, you understand that we could be missing significant injury which could result in permanent disability or death.  Please follow-up with your surgeon for recheck.  If your sutures have not fallen out after 1 week, please return to the emergency department or see your doctor for removal of the sutures.  Begin taking Augmentin.  Use Peridex to rinse your mouth out after you eat. Please return to the emergency department if you develop any new or worsening symptoms.

## 2017-11-07 ENCOUNTER — Encounter (HOSPITAL_COMMUNITY): Payer: Self-pay | Admitting: *Deleted

## 2017-11-07 ENCOUNTER — Emergency Department (HOSPITAL_COMMUNITY)
Admission: EM | Admit: 2017-11-07 | Discharge: 2017-11-07 | Disposition: A | Discharge status: Home / Self Care | Payer: Medicaid Other | Attending: Emergency Medicine | Admitting: Emergency Medicine

## 2017-11-07 ENCOUNTER — Other Ambulatory Visit: Payer: Self-pay

## 2017-11-07 DIAGNOSIS — F17210 Nicotine dependence, cigarettes, uncomplicated: Secondary | ICD-10-CM | POA: Insufficient documentation

## 2017-11-07 DIAGNOSIS — K122 Cellulitis and abscess of mouth: Secondary | ICD-10-CM

## 2017-11-07 DIAGNOSIS — I110 Hypertensive heart disease with heart failure: Secondary | ICD-10-CM | POA: Insufficient documentation

## 2017-11-07 DIAGNOSIS — I259 Chronic ischemic heart disease, unspecified: Secondary | ICD-10-CM | POA: Insufficient documentation

## 2017-11-07 DIAGNOSIS — K047 Periapical abscess without sinus: Secondary | ICD-10-CM | POA: Insufficient documentation

## 2017-11-07 DIAGNOSIS — K0889 Other specified disorders of teeth and supporting structures: Secondary | ICD-10-CM

## 2017-11-07 DIAGNOSIS — J45909 Unspecified asthma, uncomplicated: Secondary | ICD-10-CM | POA: Insufficient documentation

## 2017-11-07 DIAGNOSIS — I509 Heart failure, unspecified: Secondary | ICD-10-CM | POA: Insufficient documentation

## 2017-11-07 DIAGNOSIS — Z79899 Other long term (current) drug therapy: Secondary | ICD-10-CM | POA: Insufficient documentation

## 2017-11-07 MED ORDER — PENICILLIN V POTASSIUM 250 MG PO TABS
500.0000 mg | ORAL_TABLET | Freq: Once | ORAL | Status: AC
Start: 2017-11-07 — End: 2017-11-07
  Administered 2017-11-07: 500 mg via ORAL
  Filled 2017-11-07: qty 2

## 2017-11-07 MED ORDER — PENICILLIN V POTASSIUM 500 MG PO TABS: 500.0000 mg | ORAL_TABLET | Freq: Four times a day (QID) | ORAL | 0 refills | Status: AC

## 2017-11-07 MED FILL — PENICILLIN VK 500 MG TABLET: 500 | 7 days supply | Qty: 28 | Fill #0
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
## 2017-11-07 NOTE — ED Triage Notes (Addendum)
Pt in c/o L sided facial numbness and states, "it feels like bugs are crawling on it and it feels like someone slapped me." no slurred speech, no facial droop, denies visual changes, does not take blood thinners, onset last night @ 20:30, pt A&O x4, VAN negative

## 2017-11-07 NOTE — Discharge Instructions (Signed)
We are prescribing an antibiotic that should help with what is a likely oral infection.

## 2018-04-22 ENCOUNTER — Encounter (HOSPITAL_COMMUNITY): Payer: Self-pay

## 2018-04-22 ENCOUNTER — Emergency Department (HOSPITAL_COMMUNITY)
Admission: EM | Admit: 2018-04-22 | Discharge: 2018-04-22 | Disposition: A | Discharge status: Home / Self Care | Payer: Self-pay | Attending: Emergency Medicine | Admitting: Emergency Medicine

## 2018-04-22 ENCOUNTER — Other Ambulatory Visit: Payer: Self-pay

## 2018-04-22 DIAGNOSIS — J069 Acute upper respiratory infection, unspecified: Secondary | ICD-10-CM | POA: Insufficient documentation

## 2018-04-22 DIAGNOSIS — I110 Hypertensive heart disease with heart failure: Secondary | ICD-10-CM | POA: Insufficient documentation

## 2018-04-22 DIAGNOSIS — J45909 Unspecified asthma, uncomplicated: Secondary | ICD-10-CM | POA: Insufficient documentation

## 2018-04-22 DIAGNOSIS — F17210 Nicotine dependence, cigarettes, uncomplicated: Secondary | ICD-10-CM | POA: Insufficient documentation

## 2018-04-22 DIAGNOSIS — I509 Heart failure, unspecified: Secondary | ICD-10-CM | POA: Insufficient documentation

## 2018-04-22 DIAGNOSIS — Z79899 Other long term (current) drug therapy: Secondary | ICD-10-CM | POA: Insufficient documentation

## 2018-04-22 DIAGNOSIS — B9789 Other viral agents as the cause of diseases classified elsewhere: Secondary | ICD-10-CM

## 2018-04-22 MED ORDER — FLUTICASONE PROPIONATE 50 MCG/ACT NA SUSP: 1.0000 | Freq: Every day | NASAL | 2 refills | Status: DC

## 2018-04-22 MED ORDER — BENZONATATE 100 MG PO CAPS: 100.0000 mg | ORAL_CAPSULE | Freq: Three times a day (TID) | ORAL | 0 refills | Status: DC

## 2018-04-22 MED ORDER — BENZONATATE 100 MG PO CAPS
100.0000 mg | ORAL_CAPSULE | Freq: Once | ORAL | Status: AC
  Administered 2018-04-22: 100 mg via ORAL
  Filled 2018-04-22: qty 1
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
## 2018-04-22 NOTE — Discharge Instructions (Addendum)
Please read attached information. If you experience any new or worsening signs or symptoms please return to the emergency room for evaluation. Please follow-up with your primary care provider or specialist as discussed. Please use medication prescribed only as directed and discontinue taking if you have any concerning signs or symptoms.   °

## 2018-04-22 NOTE — ED Triage Notes (Signed)
Pt reports having a cough for one week with runny nose.  Pt denies fever.  Pt reports having productive cough.

## 2018-06-06 ENCOUNTER — Emergency Department (HOSPITAL_COMMUNITY): Payer: HRSA Program

## 2018-06-06 ENCOUNTER — Encounter (HOSPITAL_COMMUNITY): Payer: Self-pay | Admitting: Emergency Medicine

## 2018-06-06 ENCOUNTER — Emergency Department (HOSPITAL_COMMUNITY)
Admission: EM | Admit: 2018-06-06 | Discharge: 2018-06-06 | Disposition: A | Discharge status: Home / Self Care | Payer: HRSA Program | Attending: Emergency Medicine | Admitting: Emergency Medicine

## 2018-06-06 ENCOUNTER — Ambulatory Visit: Payer: Self-pay | Admitting: *Deleted

## 2018-06-06 DIAGNOSIS — J029 Acute pharyngitis, unspecified: Secondary | ICD-10-CM | POA: Insufficient documentation

## 2018-06-06 DIAGNOSIS — Z20828 Contact with and (suspected) exposure to other viral communicable diseases: Secondary | ICD-10-CM | POA: Diagnosis not present

## 2018-06-06 DIAGNOSIS — Z7982 Long term (current) use of aspirin: Secondary | ICD-10-CM | POA: Insufficient documentation

## 2018-06-06 DIAGNOSIS — R0602 Shortness of breath: Secondary | ICD-10-CM | POA: Diagnosis present

## 2018-06-06 DIAGNOSIS — I10 Essential (primary) hypertension: Secondary | ICD-10-CM | POA: Insufficient documentation

## 2018-06-06 DIAGNOSIS — J40 Bronchitis, not specified as acute or chronic: Secondary | ICD-10-CM | POA: Diagnosis not present

## 2018-06-06 DIAGNOSIS — I2510 Atherosclerotic heart disease of native coronary artery without angina pectoris: Secondary | ICD-10-CM | POA: Insufficient documentation

## 2018-06-06 DIAGNOSIS — I509 Heart failure, unspecified: Secondary | ICD-10-CM | POA: Insufficient documentation

## 2018-06-06 DIAGNOSIS — F17210 Nicotine dependence, cigarettes, uncomplicated: Secondary | ICD-10-CM | POA: Diagnosis not present

## 2018-06-06 DIAGNOSIS — I252 Old myocardial infarction: Secondary | ICD-10-CM | POA: Diagnosis not present

## 2018-06-06 DIAGNOSIS — R509 Fever, unspecified: Secondary | ICD-10-CM | POA: Insufficient documentation

## 2018-06-06 LAB — COMPREHENSIVE METABOLIC PANEL
ALT: 24 U/L (ref 0–44)
AST: 25 U/L (ref 15–41)
Albumin: 3.8 g/dL (ref 3.5–5.0)
Alkaline Phosphatase: 79 U/L (ref 38–126)
Anion gap: 14 (ref 5–15)
BUN: 13 mg/dL (ref 6–20)
CO2: 21 mmol/L — ABNORMAL LOW (ref 22–32)
Calcium: 9 mg/dL (ref 8.9–10.3)
Chloride: 108 mmol/L (ref 98–111)
Creatinine, Ser: 0.7 mg/dL (ref 0.44–1.00)
GFR calc Af Amer: >60 mL/min (ref >60)
GFR calc non Af Amer: >60 mL/min (ref >60)
Glucose, Bld: 80 mg/dL (ref 70–99)
Potassium: 3.6 mmol/L (ref 3.5–5.1)
Sodium: 143 mmol/L (ref 135–145)
Total Bilirubin: 0.9 mg/dL (ref 0.3–1.2)
Total Protein: 7.1 g/dL (ref 6.5–8.1)

## 2018-06-06 LAB — CBC WITH DIFFERENTIAL/PLATELET
Abs Immature Granulocytes: 0 10*3/uL (ref 0.00–0.07)
Basophils Absolute: 0 10*3/uL (ref 0.0–0.1)
Basophils Relative: 1 %
Eosinophils Absolute: 0.1 10*3/uL (ref 0.0–0.5)
Eosinophils Relative: 1 %
HCT: 40.9 % (ref 36.0–46.0)
Hemoglobin: 13.2 g/dL (ref 12.0–15.0)
Immature Granulocytes: 0 %
Lymphocytes Relative: 54 %
Lymphs Abs: 2.1 10*3/uL (ref 0.7–4.0)
MCH: 30.7 pg (ref 26.0–34.0)
MCHC: 32.3 g/dL (ref 30.0–36.0)
MCV: 95.1 fL (ref 80.0–100.0)
Monocytes Absolute: 0.3 10*3/uL (ref 0.1–1.0)
Monocytes Relative: 8 %
Neutro Abs: 1.4 10*3/uL — ABNORMAL LOW (ref 1.7–7.7)
Neutrophils Relative %: 36 %
Platelets: 304 10*3/uL (ref 150–400)
RBC: 4.3 MIL/uL (ref 3.87–5.11)
RDW: 13.3 % (ref 11.5–15.5)
WBC: 3.9 10*3/uL — ABNORMAL LOW (ref 4.0–10.5)
nRBC: 0 % (ref 0.0–0.2)

## 2018-06-06 LAB — TROPONIN I: Troponin I: <0.03 ng/mL (ref <0.03)

## 2018-06-06 LAB — SARS CORONAVIRUS 2 BY RT PCR (HOSPITAL ORDER, PERFORMED IN ~~LOC~~ HOSPITAL LAB): SARS Coronavirus 2: NEGATIVE

## 2018-06-06 LAB — BRAIN NATRIURETIC PEPTIDE: B Natriuretic Peptide: 21.1 pg/mL (ref 0.0–100.0)

## 2018-06-06 MED ORDER — ACETAMINOPHEN 500 MG PO TABS
1000.0000 mg | ORAL_TABLET | Freq: Once | ORAL | Status: DC
  Filled 2018-06-06: qty 2

## 2018-06-06 MED ORDER — SODIUM CHLORIDE 0.9 % IV BOLUS: 1000.0000 mL | Freq: Once | INTRAVENOUS | Status: DC

## 2018-06-06 MED ORDER — AMLODIPINE BESYLATE 5 MG PO TABS: 5.0000 mg | ORAL_TABLET | Freq: Every day | ORAL | 0 refills | Status: DC

## 2018-06-06 MED ORDER — SODIUM CHLORIDE 0.9 % IV BOLUS
1000.0000 mL | Freq: Once | INTRAVENOUS | Status: AC
  Administered 2018-06-06: 12:00:00 1000 mL via INTRAVENOUS

## 2018-06-06 MED ORDER — ACETAMINOPHEN 500 MG PO TABS
1000.0000 mg | ORAL_TABLET | Freq: Once | ORAL | Status: AC
  Administered 2018-06-06: 1000 mg via ORAL

## 2018-06-06 NOTE — Telephone Encounter (Signed)
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   She went on to explain how the conditions there are very unsanitary. She said she has to have a test for the coronavirus and a doctor's note before she can return to work.  See triage notes.  She then mentioned she had been having pain down her left arm and feeling very weak sometimes to the point she has to hold onto things.   When asked about a cardiac history she mentioned  "She had a heart attack 10 years ago".   She does not have a PCP.   "I have Medicaid and they have set me up with a doctor to see me June 6th but right now I don't have a doctor".   She mentioned she did the [facility] on-line assessment and it told her to call 911 or go to the ED now.    I have referred her to the ED.   She said [facility] because that's where she has gone before.  I called the ED at [facility] and spoke with [place].   I let her know the pt had been exposed to the COVID-19 virus at a chicken plant and about the left arm pain she is having now and heart attack 10 years ago.     Reason for Disposition . [1] Difficulty breathing occurs AND [2] within 14 days of COVID-19 EXPOSURE (Close Contact)  Answer Assessment - Initial Assessment Questions 1. CLOSE CONTACT: "Who is the person with the confirmed or suspected COVID-19 infection that you were exposed to?"     I work in a [employer].   I worked with someone who is positive for the COVID-19.    He was coughing.   He is out of work due to the virus.     My job is requiring I be tested before I go back to work.   Where can I go?    I came home and I had a nose bleed.   I have a cough and a sore throat.   No fever.   2. PLACE of CONTACT: "Where were you when you were exposed to COVID-19?" (e.g., home, school, medical waiting room; which city?)     Work at the [employer]. 3. TYPE  of CONTACT: "How much contact was there?" (e.g., sitting next to, live in same house, work in same office, same building)     Educational psychologistCo-worker.   I've been short of breath. 4. DURATION of CONTACT: "How long were you in contact with the COVID-19 patient?" (e.g., a few seconds, passed by person, a few minutes, live with the patient)     I've been working with co-workers since January.   They are not practicing safe practices at the chicken plant.    The lady in the bathroom that checks if you have washed our hands is positive for the virus now. 5. DATE of CONTACT: "When did you have contact with a COVID-19 patient?" (e.g., how many days ago)     I have been working here since January. 6. TRAVEL: "Have you traveled out of the country recently?" If so, "When and where?"     * Also ask about out-of-state travel, since the CDC has identified some high risk cities for community spread in the [place].     * Note: Travel becomes less  relevant if there is widespread community transmission where the patient lives.     No   I work with [employer]. 7. COMMUNITY SPREAD: "Are there lots of cases or COVID-19 (community spread) where you live?" (See public health department website, if unsure)   * MAJOR community spread: high number of cases; numbers of cases are increasing; many people hospitalized.   * MINOR community spread: low number of cases; not increasing; few or no people hospitalized     Major in the chicken plant.  In [place] 8. SYMPTOMS: "Do you have any symptoms?" (e.g., fever, cough, breathing difficulty)     Cough, no fever, nose been bleeding.  Exposure to co-workers who are positive.   I'm coughing up green sputum lately. 9. PREGNANCY OR POSTPARTUM: "Is there any chance you are pregnant?" "When was your last menstrual period?" "Did you deliver in the last 2 weeks?"     No   Hysterectomy   10. HIGH RISK: "Do you have any heart or lung problems? Do you have a weak immune system?" (e.g., CHF, COPD, asthma, HIV  positive, chemotherapy, renal failure, diabetes mellitus, sickle cell anemia)       I have hypertension but not taking medication.    I'm having pains in my left arm.   I'm so weak I have to hold onto things to get around.   This has been going on for 3 weeks.    I had a heart attack 10 years ago.  Protocols used: CORONAVIRUS (COVID-19) EXPOSURE-A-AH

## 2018-06-06 NOTE — ED Notes (Signed)
Patient verbalizes understanding of discharge instructions. Opportunity for questioning and answers were provided. Armband removed by staff, pt discharged from ED.  
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
## 2018-06-06 NOTE — Discharge Instructions (Signed)
You were tested for COVID 19 and is negative currently. However, the test is not perfect and you may still have COVID 19. Please follow up self quaratine instructions below and stay out of work for a week   See your doctor  Return to ER if you have worse shortness of breath, fever, cough.      Person Under Monitoring Name: Katherine [name]  Location: [address] [place] [id number]   Infection Prevention Recommendations for Individuals Confirmed to have, or Being Evaluated for, 2019 Novel Coronavirus (COVID-19) Infection Who Receive Care at Home  Individuals who are confirmed to have, or are being evaluated for, COVID-19 should follow the prevention steps below until a healthcare provider or local or state health department says they can return to normal activities.  Stay home except to get medical care You should restrict activities outside your home, except for getting medical care. Do not go to work, school, or public areas, and do not use public transportation or taxis.  Call ahead before visiting your doctor Before your medical appointment, call the healthcare provider and tell them that you have, or are being evaluated for, COVID-19 infection. This will help the healthcare providers office take steps to keep other people from getting infected. Ask your healthcare provider to call the local or state health department.  Monitor your symptoms Seek prompt medical attention if your illness is worsening (e.g., difficulty breathing). Before going to your medical appointment, call the healthcare provider and tell them that you have, or are being evaluated for, COVID-19 infection. Ask your healthcare provider to call the local or state health department.  Wear a facemask You should wear a facemask that covers your nose and mouth when you are in the same room with other people and when you visit a healthcare provider. People who live with or visit you should also wear a facemask  while they are in the same room with you.  Separate yourself from other people in your home As much as possible, you should stay in a different room from other people in your home. Also, you should use a separate bathroom, if available.  Avoid sharing household items You should not share dishes, drinking glasses, cups, eating utensils, towels, bedding, or other items with other people in your home. After using these items, you should wash them thoroughly with soap and water.  Cover your coughs and sneezes Cover your mouth and nose with a tissue when you cough or sneeze, or you can cough or sneeze into your sleeve. Throw used tissues in a lined trash can, and immediately wash your hands with soap and water for at least 20 seconds or use an alcohol-based hand rub.  Wash your [employer] Pacific Corporationhands Wash your hands often and thoroughly with soap and water for at least 20 seconds. You can use an alcohol-based hand sanitizer if soap and water are not available and if your hands are not visibly dirty. Avoid touching your eyes, nose, and mouth with unwashed hands.   Prevention Steps for Caregivers and Household Members of Individuals Confirmed to have, or Being Evaluated for, COVID-19 Infection Being Cared for in the Home  If you live with, or provide care at home for, a person confirmed to have, or being evaluated for, COVID-19 infection please follow these guidelines to prevent infection:  Follow healthcare providers instructions Make sure that you understand and can help the patient follow any healthcare provider instructions for all care.  Provide for the patients basic  needs You should help the patient with basic needs in the home and provide support for getting groceries, prescriptions, and other personal needs.  Monitor the patients symptoms If they are getting sicker, call his or her medical provider and tell them that the patient has, or is being evaluated for, COVID-19 infection. This will  help the healthcare providers office take steps to keep other people from getting infected. Ask the healthcare provider to call the local or state health department.  Limit the number of people who have contact with the patient If possible, have only one caregiver for the patient. Other household members should stay in another home or place of residence. If this is not possible, they should stay in another room, or be separated from the patient as much as possible. Use a separate bathroom, if available. Restrict visitors who do not have an essential need to be in the home.  Keep older adults, very young children, and other sick people away from the patient Keep older adults, very young children, and those who have compromised immune systems or chronic health conditions away from the patient. This includes people with chronic heart, lung, or kidney conditions, diabetes, and cancer.  Ensure good ventilation Make sure that shared spaces in the home have good air flow, such as from an air conditioner or an opened window, weather permitting.  Wash your hands often Wash your hands often and thoroughly with soap and water for at least 20 seconds. You can use an alcohol based hand sanitizer if soap and water are not available and if your hands are not visibly dirty. Avoid touching your eyes, nose, and mouth with unwashed hands. Use disposable paper towels to dry your hands. If not available, use dedicated cloth towels and replace them when they become wet.  Wear a facemask and gloves Wear a disposable facemask at all times in the room and gloves when you touch or have contact with the patients blood, body fluids, and/or secretions or excretions, such as sweat, saliva, sputum, nasal mucus, vomit, urine, or feces.  Ensure the mask fits over your nose and mouth tightly, and do not touch it during use. Throw out disposable facemasks and gloves after using them. Do not reuse. Wash your hands immediately  after removing your facemask and gloves. If your personal clothing becomes contaminated, carefully remove clothing and launder. Wash your hands after handling contaminated clothing. Place all used disposable facemasks, gloves, and other waste in a lined container before disposing them with other household waste. Remove gloves and wash your hands immediately after handling these items.  Do not share dishes, glasses, or other household items with the patient Avoid sharing household items. You should not share dishes, drinking glasses, cups, eating utensils, towels, bedding, or other items with a patient who is confirmed to have, or being evaluated for, COVID-19 infection. After the person uses these items, you should wash them thoroughly with soap and water.  Wash laundry thoroughly Immediately remove and wash clothes or bedding that have blood, body fluids, and/or secretions or excretions, such as sweat, saliva, sputum, nasal mucus, vomit, urine, or feces, on them. Wear gloves when handling laundry from the patient. Read and follow directions on labels of laundry or clothing items and detergent. In general, wash and dry with the warmest temperatures recommended on the label.  Clean all areas the individual has used often Clean all touchable surfaces, such as counters, tabletops, doorknobs, bathroom fixtures, toilets, phones, keyboards, tablets, and bedside tables, every day.  Also, clean any surfaces that may have blood, body fluids, and/or secretions or excretions on them. Wear gloves when cleaning surfaces the patient has come in contact with. Use a diluted bleach solution (e.g., dilute bleach with 1 part bleach and 10 parts water) or a household disinfectant with a label that says EPA-registered for coronaviruses. To make a bleach solution at home, add 1 tablespoon of bleach to 1 quart (4 cups) of water. For a larger supply, add  cup of bleach to 1 gallon (16 cups) of water. Read labels of  cleaning products and follow recommendations provided on product labels. Labels contain instructions for safe and effective use of the cleaning product including precautions you should take when applying the product, such as wearing gloves or eye protection and making sure you have good ventilation during use of the product. Remove gloves and wash hands immediately after cleaning.  Monitor yourself for signs and symptoms of illness Caregivers and household members are considered close contacts, should monitor their health, and will be asked to limit movement outside of the home to the extent possible. Follow the monitoring steps for close contacts listed on the symptom monitoring form.   ? If you have additional questions, contact your local health department or call the epidemiologist on call at [phone] (available 24/7). ? This guidance is subject to change. For the most up-to-date guidance from [facility], please refer to their website: [employer]hu

## 2018-06-06 NOTE — ED Triage Notes (Signed)
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 Also reports her boyfriend is sick in the hospital with pneumonia.

## 2018-06-06 NOTE — ED Notes (Addendum)
Portable xray at bedside.

## 2018-06-07 LAB — HIV ANTIBODY (ROUTINE TESTING W REFLEX): HIV Screen 4th Generation wRfx: NONREACTIVE

## 2018-08-05 ENCOUNTER — Encounter: Payer: Self-pay | Admitting: Internal Medicine

## 2018-08-05 ENCOUNTER — Encounter (HOSPITAL_COMMUNITY): Payer: Self-pay | Admitting: Emergency Medicine

## 2018-08-05 ENCOUNTER — Ambulatory Visit (INDEPENDENT_AMBULATORY_CARE_PROVIDER_SITE_OTHER): Payer: Self-pay | Admitting: Internal Medicine

## 2018-08-05 ENCOUNTER — Emergency Department (HOSPITAL_COMMUNITY): Payer: Self-pay

## 2018-08-05 ENCOUNTER — Other Ambulatory Visit: Payer: Self-pay

## 2018-08-05 ENCOUNTER — Emergency Department (HOSPITAL_COMMUNITY)
Admission: EM | Admit: 2018-08-05 | Discharge: 2018-08-05 | Disposition: A | Discharge status: Home / Self Care | Payer: Self-pay | Attending: Emergency Medicine | Admitting: Emergency Medicine

## 2018-08-05 VITALS — BP 160/110 | HR 94 | Temp 97.9°F | Ht 66.0 in | Wt 179.2 lb

## 2018-08-05 DIAGNOSIS — R55 Syncope and collapse: Secondary | ICD-10-CM

## 2018-08-05 DIAGNOSIS — I10 Essential (primary) hypertension: Secondary | ICD-10-CM | POA: Insufficient documentation

## 2018-08-05 DIAGNOSIS — R61 Generalized hyperhidrosis: Secondary | ICD-10-CM

## 2018-08-05 DIAGNOSIS — K219 Gastro-esophageal reflux disease without esophagitis: Secondary | ICD-10-CM

## 2018-08-05 DIAGNOSIS — Z87891 Personal history of nicotine dependence: Secondary | ICD-10-CM | POA: Insufficient documentation

## 2018-08-05 DIAGNOSIS — Z20828 Contact with and (suspected) exposure to other viral communicable diseases: Secondary | ICD-10-CM | POA: Insufficient documentation

## 2018-08-05 DIAGNOSIS — Z79899 Other long term (current) drug therapy: Secondary | ICD-10-CM | POA: Insufficient documentation

## 2018-08-05 DIAGNOSIS — Z8709 Personal history of other diseases of the respiratory system: Secondary | ICD-10-CM | POA: Insufficient documentation

## 2018-08-05 DIAGNOSIS — I158 Other secondary hypertension: Secondary | ICD-10-CM

## 2018-08-05 DIAGNOSIS — R531 Weakness: Secondary | ICD-10-CM

## 2018-08-05 DIAGNOSIS — I252 Old myocardial infarction: Secondary | ICD-10-CM | POA: Insufficient documentation

## 2018-08-05 DIAGNOSIS — Y906 Blood alcohol level of 120-199 mg/100 ml: Secondary | ICD-10-CM | POA: Insufficient documentation

## 2018-08-05 DIAGNOSIS — F10920 Alcohol use, unspecified with intoxication, uncomplicated: Secondary | ICD-10-CM

## 2018-08-05 DIAGNOSIS — I2510 Atherosclerotic heart disease of native coronary artery without angina pectoris: Secondary | ICD-10-CM | POA: Insufficient documentation

## 2018-08-05 DIAGNOSIS — I5032 Chronic diastolic (congestive) heart failure: Secondary | ICD-10-CM

## 2018-08-05 DIAGNOSIS — R5383 Other fatigue: Secondary | ICD-10-CM

## 2018-08-05 DIAGNOSIS — Z7982 Long term (current) use of aspirin: Secondary | ICD-10-CM | POA: Insufficient documentation

## 2018-08-05 LAB — I-STAT BETA HCG BLOOD, ED (MC, WL, AP ONLY): I-stat hCG, quantitative: <5 m[IU]/mL (ref <5)

## 2018-08-05 LAB — CBC WITH DIFFERENTIAL/PLATELET
Abs Immature Granulocytes: 0 10*3/uL (ref 0.00–0.07)
Basophils Absolute: 0 10*3/uL (ref 0.0–0.1)
Basophils Relative: 1 %
Eosinophils Absolute: 0.1 10*3/uL (ref 0.0–0.5)
Eosinophils Relative: 2 %
HCT: 41.5 % (ref 36.0–46.0)
Hemoglobin: 13.3 g/dL (ref 12.0–15.0)
Immature Granulocytes: 0 %
Lymphocytes Relative: 47 %
Lymphs Abs: 2.5 10*3/uL (ref 0.7–4.0)
MCH: 30.2 pg (ref 26.0–34.0)
MCHC: 32 g/dL (ref 30.0–36.0)
MCV: 94.1 fL (ref 80.0–100.0)
Monocytes Absolute: 0.3 10*3/uL (ref 0.1–1.0)
Monocytes Relative: 6 %
Neutro Abs: 2.3 10*3/uL (ref 1.7–7.7)
Neutrophils Relative %: 44 %
Platelets: 318 10*3/uL (ref 150–400)
RBC: 4.41 MIL/uL (ref 3.87–5.11)
RDW: 13.8 % (ref 11.5–15.5)
WBC: 5.3 10*3/uL (ref 4.0–10.5)
nRBC: 0 % (ref 0.0–0.2)

## 2018-08-05 LAB — COMPREHENSIVE METABOLIC PANEL
ALT: 22 U/L (ref 0–44)
AST: 22 U/L (ref 15–41)
Albumin: 3.9 g/dL (ref 3.5–5.0)
Alkaline Phosphatase: 73 U/L (ref 38–126)
Anion gap: 10 (ref 5–15)
BUN: 13 mg/dL (ref 6–20)
CO2: 23 mmol/L (ref 22–32)
Calcium: 8.9 mg/dL (ref 8.9–10.3)
Chloride: 108 mmol/L (ref 98–111)
Creatinine, Ser: 0.74 mg/dL (ref 0.44–1.00)
GFR calc Af Amer: >60 mL/min (ref >60)
GFR calc non Af Amer: >60 mL/min (ref >60)
Glucose, Bld: 80 mg/dL (ref 70–99)
Potassium: 3.5 mmol/L (ref 3.5–5.1)
Sodium: 141 mmol/L (ref 135–145)
Total Bilirubin: 1 mg/dL (ref 0.3–1.2)
Total Protein: 7.4 g/dL (ref 6.5–8.1)

## 2018-08-05 LAB — BRAIN NATRIURETIC PEPTIDE: B Natriuretic Peptide: 14.5 pg/mL (ref 0.0–100.0)

## 2018-08-05 LAB — GROUP A STREP BY PCR: Group A Strep by PCR: NOT DETECTED

## 2018-08-05 LAB — ETHANOL: Alcohol, Ethyl (B): 159 mg/dL — ABNORMAL HIGH (ref <10)

## 2018-08-05 LAB — PHOSPHORUS: Phosphorus: 3.4 mg/dL (ref 2.5–4.6)

## 2018-08-05 LAB — LIPASE, BLOOD: Lipase: 25 U/L (ref 11–51)

## 2018-08-05 LAB — CK: Total CK: 143 U/L (ref 38–234)

## 2018-08-05 LAB — SARS CORONAVIRUS 2 BY RT PCR (HOSPITAL ORDER, PERFORMED IN ~~LOC~~ HOSPITAL LAB): SARS Coronavirus 2: NEGATIVE

## 2018-08-05 LAB — MAGNESIUM: Magnesium: 2 mg/dL (ref 1.7–2.4)

## 2018-08-05 LAB — TROPONIN I (HIGH SENSITIVITY): Troponin I (High Sensitivity): 3 ng/L (ref <18)

## 2018-08-05 LAB — LACTIC ACID, PLASMA: Lactic Acid, Venous: 1.6 mmol/L (ref 0.5–1.9)

## 2018-08-05 MED ORDER — ALUM & MAG HYDROXIDE-SIMETH 200-200-20 MG/5ML PO SUSP: 15.0000 mL | Freq: Four times a day (QID) | ORAL | 1 refills | Status: DC | PRN

## 2018-08-05 MED ORDER — LIDOCAINE VISCOUS HCL 2 % MT SOLN
15.0000 mL | Freq: Once | OROMUCOSAL | Status: AC
  Administered 2018-08-05: 15 mL via ORAL
  Filled 2018-08-05: qty 15

## 2018-08-05 MED ORDER — AMLODIPINE BESYLATE 5 MG PO TABS: 5.0000 mg | ORAL_TABLET | Freq: Every day | ORAL | 0 refills | Status: DC

## 2018-08-05 MED ORDER — SODIUM CHLORIDE 0.9 % IV SOLN: Freq: Once | INTRAVENOUS | Status: DC

## 2018-08-05 MED ORDER — ALBUTEROL SULFATE HFA 108 (90 BASE) MCG/ACT IN AERS
INHALATION_SPRAY | RESPIRATORY_TRACT | Status: AC
  Filled 2018-08-05: qty 6.7

## 2018-08-05 MED ORDER — METOPROLOL TARTRATE 25 MG PO TABS: 25.0000 mg | ORAL_TABLET | Freq: Two times a day (BID) | ORAL | 1 refills | Status: DC

## 2018-08-05 MED ORDER — ACETAMINOPHEN 500 MG PO TABS
1000.0000 mg | ORAL_TABLET | Freq: Once | ORAL | Status: AC
  Administered 2018-08-05: 1000 mg via ORAL
  Filled 2018-08-05: qty 2

## 2018-08-05 MED ORDER — FAMOTIDINE IN NACL 20-0.9 MG/50ML-% IV SOLN
20.0000 mg | Freq: Once | INTRAVENOUS | Status: DC
  Filled 2018-08-05: qty 50

## 2018-08-05 MED ORDER — SODIUM CHLORIDE 0.9 % IV BOLUS
1000.0000 mL | Freq: Once | INTRAVENOUS | Status: AC
  Administered 2018-08-05: 1000 mL via INTRAVENOUS

## 2018-08-05 MED ORDER — OMEPRAZOLE 20 MG PO CPDR: 20.0000 mg | DELAYED_RELEASE_CAPSULE | Freq: Every day | ORAL | 1 refills | Status: DC

## 2018-08-05 MED ORDER — ALUM & MAG HYDROXIDE-SIMETH 200-200-20 MG/5ML PO SUSP
30.0000 mL | Freq: Once | ORAL | Status: AC
  Administered 2018-08-05: 30 mL via ORAL
  Filled 2018-08-05: qty 30

## 2018-08-05 NOTE — ED Notes (Signed)
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

## 2018-08-05 NOTE — ED Notes (Addendum)
Pt pulled out her IV and removed all monitoring equipment.

## 2018-08-05 NOTE — ED Notes (Signed)
As pt was drinking GI cocktail, states she received immediate relief. Requested for samples to take home. Pt provided with food and beverage.

## 2018-08-05 NOTE — Discharge Instructions (Addendum)
1.  Start taking Maalox as needed and Prilosec daily.  Follow instructions for reflux disease.  Alcohol consumption will worsen symptoms.  Try to avoid alcohol. 2.  Your heart labs and x-rays are normal today.  You do however have history of heart and lung problems and should follow-up with your doctor for continued evaluation and treatment.  Continue your prescribed medications. 3.  Your rapid coronavirus testing was negative today.

## 2018-08-05 NOTE — Progress Notes (Signed)
New Patient Office Visit     CC/Reason for Visit: Establish care, needs work form filled out Previous PCP: Unknown Last Visit: Unknown, but likely around 2001  HPI: Katherine [name] is a 53 y.o. female who is coming in today for the above mentioned reasons.  She has not had routine medical care in about 20 years.  She tells me that in 2001 she was hospitalized for congestive heart failure, she was on mechanical ventilation; she was told she had very high blood pressure.  However after that she had a "prayer circle" and decided that she would go off all medications and would instead focus on diet and lifestyle modifications.  She started working at a [employer] in January, has had difficulties performing her job functions.  She comes in with the nursing note today that states that her blood pressure has been high in the 180/110 range, has been having left arm pain, difficulty gripping utensils with that hand.  The form wants me to specify what job restrictions she needs.  Upon further questioning she tells me that she does have left arm tingling on occasion, dyspnea on exertion, she does get dizzy and lightheaded.  She has never described chest pain.  The only medication she is currently taking is a baby aspirin.  At one point she was prescribed Norvasc but she never started taking it.  She is a never smoker, drinks alcohol occasionally.  Her family history significant for mother with diabetes and congestive heart failure, father with congestive heart failure, diabetes as well as Alzheimer's dementia, sister also had congestive heart failure.   Past Medical/Surgical History: Past Medical History:  Diagnosis Date  . Asthma   . Asthma   . Bronchitis   . CHF (congestive heart failure) (HCC)   . Coronary artery disease    MI 2007  . Hypertension     Past Surgical History:  Procedure Laterality Date  . ABDOMINAL HYSTERECTOMY    . HERNIA REPAIR    . TUBAL LIGATION    . TUBAL  LIGATION     R only    Social History:  reports that she has quit smoking. Her smoking use included cigarettes. She has never used smokeless tobacco. She reports current alcohol use. She reports that she does not use drugs.  Allergies: No Known Allergies  Family History:  Family History  Problem Relation Age of Onset  . Coronary artery disease Other   . Diabetes Mother   . Hypertension Mother   . Heart failure Mother      Current Outpatient Medications:  .  albuterol (PROVENTIL HFA;VENTOLIN HFA) 108 (90 BASE) MCG/ACT inhaler, Inhale 2 puffs into the lungs every 6 (six) hours as needed for wheezing., Disp: 1 Inhaler, Rfl: 2 .  amLODipine (NORVASC) 5 MG tablet, Take 1 tablet (5 mg total) by mouth daily., Disp: 90 tablet, Rfl: 0 .  aspirin EC 81 MG tablet, Take 1 tablet (81 mg total) by mouth daily., Disp: 30 tablet, Rfl: 0 .  chlorhexidine (PERIDEX) 0.12 % solution, Use as directed 15 mLs in the mouth or throat 2 (two) times daily., Disp: 120 mL, Rfl: 0 .  fluticasone (FLONASE) 50 MCG/ACT nasal spray, Place 1 spray into both nostrils daily., Disp: 9.9 g, Rfl: 2 .  ibuprofen (ADVIL,MOTRIN) 800 MG tablet, Take 1 tablet (800 mg total) by mouth 3 (three) times daily., Disp: 21 tablet, Rfl: 0  Review of Systems:  Constitutional: Denies fever, chills, diaphoresis, appetite change and  fatigue.  HEENT: Denies photophobia, eye pain, redness, hearing loss, ear pain, congestion, sore throat, rhinorrhea, sneezing, mouth sores, trouble swallowing, neck pain, neck stiffness and tinnitus.   Respiratory: Denies cough, chest tightness,  and wheezing.   Cardiovascular: Denies chest pain, palpitations and leg swelling.  Gastrointestinal: Denies nausea, vomiting, abdominal pain, diarrhea, constipation, blood in stool and abdominal distention.  Genitourinary: Denies dysuria, urgency, frequency, hematuria, flank pain and difficulty urinating.  Endocrine: Denies: hot or cold intolerance, sweats, changes in  hair or nails, polyuria, polydipsia. Musculoskeletal: Denies myalgias, back pain, joint swelling, arthralgias and gait problem.  Skin: Denies pallor, rash and wound.  Neurological: Denies dizziness, seizures, syncope, weakness, light-headedness, numbness and headaches.  Hematological: Denies adenopathy. Easy bruising, personal or family bleeding history  Psychiatric/Behavioral: Denies suicidal ideation, mood changes, confusion, nervousness, sleep disturbance and agitation    Physical Exam: Vitals:   08/05/18 1308 08/05/18 1327  BP: 130/90 (!) 160/110  Pulse: 94   Temp: 97.9 F (36.6 C)   TempSrc: Oral   SpO2: 98%   Weight: 179 lb 3.2 oz (81.3 kg)   Height: 5\' 6"  (1.676 m)    Body mass index is 28.92 kg/m.   Constitutional: NAD, calm, comfortable Eyes: PERRL, lids and conjunctivae normal ENMT: Mucous membranes are moist.  Neck: normal, supple, no masses, no thyromegaly Respiratory: clear to auscultation bilaterally, no wheezing, no crackles. Normal respiratory effort. No accessory muscle use.  Cardiovascular: Regular rate and rhythm, no murmurs / rubs / gallops. No extremity edema. 2+ pedal pulses. No carotid bruits.  Abdomen: no tenderness, no masses palpated. No hepatosplenomegaly. Bowel sounds positive.  Musculoskeletal: no clubbing / cyanosis. No joint deformity upper and lower extremities. Good ROM, no contractures. Normal muscle tone.  Skin: no rashes, lesions, ulcers. No induration Neurologic: Grossly intact and nonfocal Psychiatric: Normal judgment and insight. Alert and oriented x 3. Normal mood.    Impression and Plan:  Other secondary hypertension  -BP is quite elevated in office today to 160/110 which is in accordance to levels from her nursing note from work. -With these numbers in addition to her history of what appears to be diastolic congestive heart failure, will commence treatment with metoprolol 25 mg twice a day. -She will return in 4 to 6 weeks for  follow-up.  Gastroesophageal reflux disease without esophagitis -Appears to be asymptomatic, not on medications.  Diastolic CHF, chronic (HCC)  -No recent echo on file, will request updated echo today. -Continue aspirin 81 mg. -Will check labs today including CBC, CMP, lipids to further risk stratify her. -EKG performed in office today and interpreted by myself shows sinus tachycardia at a rate of 107, normal axis, there do not appear to be any acute ischemic changes.  -Have advised patient that I cannot fill out her work form today.  We have to see how she responds to blood pressure medication and final results of blood work and echocardiogram before I can make a decision on work restrictions.     Patient Instructions  -Nice meeting you today!  -Lab work; will notify you once results are available.  -Will schedule you for a heart ultrasound.  -Schedule follow up in 4-6 weeks.   Hypertension Hypertension is another name for high blood pressure. High blood pressure forces your heart to work harder to pump blood. This can cause problems over time. There are two numbers in a blood pressure reading. There is a top number (systolic) over a bottom number (diastolic). It is best to have a  blood pressure below 120/80. Healthy choices can help lower your blood pressure. You may need medicine to help lower your blood pressure if:  Your blood pressure cannot be lowered with healthy choices.  Your blood pressure is higher than 130/80. Follow these instructions at home: Eating and drinking   If directed, follow the DASH eating plan. This diet includes: ? Filling half of your plate at each meal with fruits and vegetables. ? Filling one quarter of your plate at each meal with whole grains. Whole grains include whole wheat pasta, brown rice, and whole grain bread. ? Eating or drinking low-fat dairy products, such as skim milk or low-fat yogurt. ? Filling one quarter of your plate at each  meal with low-fat (lean) proteins. Low-fat proteins include fish, skinless chicken, eggs, beans, and tofu. ? Avoiding fatty meat, cured and processed meat, or chicken with skin. ? Avoiding premade or processed food.  Eat less than 1,500 mg of salt (sodium) a day.  Limit alcohol use to no more than 1 drink a day for nonpregnant women and 2 drinks a day for men. One drink equals 12 oz of beer, 5 oz of wine, or 1 oz of hard liquor. Lifestyle  Work with your doctor to stay at a healthy weight or to lose weight. Ask your doctor what the best weight is for you.  Get at least 30 minutes of exercise that causes your heart to beat faster (aerobic exercise) most days of the week. This may include walking, swimming, or biking.  Get at least 30 minutes of exercise that strengthens your muscles (resistance exercise) at least 3 days a week. This may include lifting weights or pilates.  Do not use any products that contain nicotine or tobacco. This includes cigarettes and e-cigarettes. If you need help quitting, ask your doctor.  Check your blood pressure at home as told by your doctor.  Keep all follow-up visits as told by your doctor. This is important. Medicines  Take over-the-counter and prescription medicines only as told by your doctor. Follow directions carefully.  Do not skip doses of blood pressure medicine. The medicine does not work as well if you skip doses. Skipping doses also puts you at risk for problems.  Ask your doctor about side effects or reactions to medicines that you should watch for. Contact a doctor if:  You think you are having a reaction to the medicine you are taking.  You have headaches that keep coming back (recurring).  You feel dizzy.  You have swelling in your ankles.  You have trouble with your vision. Get help right away if:  You get a very bad headache.  You start to feel confused.  You feel weak or numb.  You feel faint.  You get very bad pain in  your: ? Chest. ? Belly (abdomen).  You throw up (vomit) more than once.  You have trouble breathing. Summary  Hypertension is another name for high blood pressure.  Making healthy choices can help lower blood pressure. If your blood pressure cannot be controlled with healthy choices, you may need to take medicine. This information is not intended to replace advice given to you by your health care provider. Make sure you discuss any questions you have with your health care provider. Document Released: 07/18/2007 Document Revised: 12/28/2015 Document Reviewed: 12/28/2015 Elsevier Interactive Patient Education  2019 Elsevier Inc.      Chaya JanEstela Hernandez Acosta, MD [facility]

## 2018-08-05 NOTE — Patient Instructions (Addendum)
-Nice meeting you today!  -Lab work; will notify you once results are available.  -Will schedule you for a heart ultrasound.  -Schedule follow up in 4-6 weeks.   Hypertension Hypertension is another name for high blood pressure. High blood pressure forces your heart to work harder to pump blood. This can cause problems over time. There are two numbers in a blood pressure reading. There is a top number (systolic) over a bottom number (diastolic). It is best to have a blood pressure below 120/80. Healthy choices can help lower your blood pressure. You may need medicine to help lower your blood pressure if:  Your blood pressure cannot be lowered with healthy choices.  Your blood pressure is higher than 130/80. Follow these instructions at home: Eating and drinking   If directed, follow the DASH eating plan. This diet includes: ? Filling half of your plate at each meal with fruits and vegetables. ? Filling one quarter of your plate at each meal with whole grains. Whole grains include whole wheat pasta, brown rice, and whole grain bread. ? Eating or drinking low-fat dairy products, such as skim milk or low-fat yogurt. ? Filling one quarter of your plate at each meal with low-fat (lean) proteins. Low-fat proteins include fish, skinless chicken, eggs, beans, and tofu. ? Avoiding fatty meat, cured and processed meat, or chicken with skin. ? Avoiding premade or processed food.  Eat less than 1,500 mg of salt (sodium) a day.  Limit alcohol use to no more than 1 drink a day for nonpregnant women and 2 drinks a day for men. One drink equals 12 oz of beer, 5 oz of wine, or 1 oz of hard liquor. Lifestyle  Work with your doctor to stay at a healthy weight or to lose weight. Ask your doctor what the best weight is for you.  Get at least 30 minutes of exercise that causes your heart to beat faster (aerobic exercise) most days of the week. This may include walking, swimming, or biking.  Get at  least 30 minutes of exercise that strengthens your muscles (resistance exercise) at least 3 days a week. This may include lifting weights or pilates.  Do not use any products that contain nicotine or tobacco. This includes cigarettes and e-cigarettes. If you need help quitting, ask your doctor.  Check your blood pressure at home as told by your doctor.  Keep all follow-up visits as told by your doctor. This is important. Medicines  Take over-the-counter and prescription medicines only as told by your doctor. Follow directions carefully.  Do not skip doses of blood pressure medicine. The medicine does not work as well if you skip doses. Skipping doses also puts you at risk for problems.  Ask your doctor about side effects or reactions to medicines that you should watch for. Contact a doctor if:  You think you are having a reaction to the medicine you are taking.  You have headaches that keep coming back (recurring).  You feel dizzy.  You have swelling in your ankles.  You have trouble with your vision. Get help right away if:  You get a very bad headache.  You start to feel confused.  You feel weak or numb.  You feel faint.  You get very bad pain in your: ? Chest. ? Belly (abdomen).  You throw up (vomit) more than once.  You have trouble breathing. Summary  Hypertension is another name for high blood pressure.  Making healthy choices can help lower blood pressure.  If your blood pressure cannot be controlled with healthy choices, you may need to take medicine. This information is not intended to replace advice given to you by your health care provider. Make sure you discuss any questions you have with your health care provider. Document Released: 07/18/2007 Document Revised: 12/28/2015 Document Reviewed: 12/28/2015 Elsevier Interactive Patient Education  2019 [employer]
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
## 2018-08-05 NOTE — ED Triage Notes (Signed)
[place] multiple complaints via ems- pt was just seen at PCP and states she left and felt like she was going to pass out at the bus stop- ems found her under a tree sitting alert and ox4. Pt states she feels like she cant breathe denies CP.

## 2018-08-05 NOTE — ED Notes (Signed)
Pt refused Orthostatic VS.  

## 2018-08-05 NOTE — ED Notes (Signed)
Pt refuses IV start and  Blood draw yelling at staff and videoing with her phone

## 2018-08-05 NOTE — ED Notes (Signed)
Pt requested to eat; note sent to MD

## 2018-08-06 ENCOUNTER — Telehealth: Payer: Self-pay | Admitting: Internal Medicine

## 2018-08-06 NOTE — Telephone Encounter (Signed)
I just discussed this with her during her visit yesterday. I cannot clear her to work, unfortunately until her work up is complete.

## 2018-08-06 NOTE — Telephone Encounter (Signed)
Fax sent and confirmed.  Spoke with Myra and she will inform the patient.

## 2018-08-06 NOTE — Telephone Encounter (Signed)
Patient called office. Informed patient per Dr. Jerilee Hoh it is not for this reason that she is not working. It is because of the chest and left arm pain. I have not taken her out of work. The nurse at her job will not let her return until I have cleared her, which I cannot until ECHO and other tests return.   Patient reports she needs a timeframe for her job as to how long she will be out for the ECHO and other test. Patients verbalizes we can call Myra at her job [phone]. Patient " I was homeless two years ago, I don't want to be in that position again."  Patient reports a response is need for her job before [place].

## 2018-08-06 NOTE — Telephone Encounter (Signed)
Patient called again to have the note that she is requesting be faxed to her job because she has no way to get it to them.  The fax # is [phone].  If there are any questions, please call patient at [phone]

## 2018-08-06 NOTE — Telephone Encounter (Signed)
Patient calling and states that she just spoke with her employer and they are needing a work clearance paper. 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 States that she is about to lose her job because she cannot go to work with blood pressures as high as hers is. Please advise. Would like a call back to discuss.

## 2018-08-06 NOTE — Telephone Encounter (Signed)
Patient called and would like to talk to Dr. Jerilee Hoh about a note she needs to return back to work. Please call patient back, thanks.

## 2018-08-06 NOTE — Telephone Encounter (Signed)
No. It is not for this reason that she is not working. It is because of the chest and left arm pain. I have not taken her out of work. The nurse at her job will not let her return until I have cleared her, which I cannot until ECHO and other tests return.Marland KitchenMarland Kitchen

## 2018-08-06 NOTE — Telephone Encounter (Signed)
Patient is requesting a letter stating that her blood pressure is high and that she should not go back to work.  Okay to write?

## 2018-08-12 ENCOUNTER — Ambulatory Visit: Payer: Self-pay | Admitting: *Deleted

## 2018-08-12 NOTE — Telephone Encounter (Signed)
Dr Jerilee Hoh noted.  Patient can not be cleared for work until all test results come back.  Patient was informed of this multiple times.

## 2018-08-12 NOTE — Telephone Encounter (Signed)
Attempted to call patient x 3.  Unable to leave a message.  Will try again at another time.  CRM

## 2018-08-12 NOTE — Telephone Encounter (Signed)
Pt calling back and stated that she really needs to go back to work. She states that she may lose her house due to not having a pay check. Pt would like a note to release her back to work. Please advise

## 2018-08-12 NOTE — Telephone Encounter (Addendum)
I returned her call where she had called in requesting a call back.    " I'm on a BP pill that ER nurse gave me."  " I thought Dr. Jerilee Hoh gave me a BP pill."     I went to my  Pharmacy they do not have it.  I'm still sweating.   I think it's hormones.   Dr. Jerilee Hoh said it's my heart working too fast.   "All these years I thought these symptoms were my hormones but the doctor said it's my heart and I have very high BP".  I'm pouring sweat during the night and have a headache.   160/101 on 08/05/2018. I need to go back to work.   I'm to have a echo for my heart.   I don;t have any money.    I'm having shortness of breath.  I went to the ER August 05, 2018.   My job wants me to have a note from my doctor to clear me before I can come to work. My insurance will start in November.    I work up on a catwalk up high.  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  I work in a Engineer, manufacturing systems.   I work in a cold environment.  I don't have any money to come see y'all again.   It's up to my doctor to clear me.  I don't want to end up homeless again.  I'm going to walk to [employer] to check my BP.  I'm having a pain in my left arm, I'm lightheaded.   I'm short of breath and having  pain in my lower back.    "I have to take baby steps just to walk from the mailbox and back".   "I have to go up steps to get into my house and I have to stop because I'm so short of breath".    "Sometimes I have chest pain like a poking in the left side of my chest at night". I don't know when my echo for my heart is to be done.   I never heard anything. I went to the ED 623/2020 and they said my heart was ok.   The COVID-19 test was negative.    I need my job.  " I'll just lay here in this bed until I hear something from my doctor". I thought I was having hot flashes all these years but I was told it's my heart not my hormones.   I suggested she go back to the ED.   She's going to have her son drive her to the [facility] so she can check  her BP.  I sent this note to Dr. Isaac Bliss and called the office to make them aware.     Reason for Disposition . [1] Caller requesting NON-URGENT health information AND [2] PCP's office is the best resource    See documentation notes for details of this call.  Answer Assessment - Initial Assessment Questions 1. REASON FOR CALL or QUESTION: "What is your reason for calling today?" or "How can I best help you?" or "What question do you have that I can help answer?"     See my documentation notes.   This lady called in with multiple issues and complaints.  Protocols used: INFORMATION ONLY CALL-A-AH

## 2018-08-12 NOTE — Telephone Encounter (Signed)
With CP and SOB recommend ED evaluation.

## 2018-08-14 NOTE — Telephone Encounter (Signed)
Unable to reach the patient

## 2018-08-18 ENCOUNTER — Telehealth: Payer: Self-pay | Admitting: Internal Medicine

## 2018-08-18 NOTE — Telephone Encounter (Signed)
Patient is calling regarding the scheduling for her echocardiogram.   Please advise [phone]

## 2018-08-19 ENCOUNTER — Telehealth: Payer: Self-pay | Admitting: *Deleted

## 2018-08-19 ENCOUNTER — Other Ambulatory Visit: Payer: Self-pay | Admitting: Internal Medicine

## 2018-08-19 ENCOUNTER — Telehealth: Payer: Self-pay | Admitting: Internal Medicine

## 2018-08-19 DIAGNOSIS — I5032 Chronic diastolic (congestive) heart failure: Secondary | ICD-10-CM

## 2018-08-19 NOTE — Telephone Encounter (Signed)
Copied from [place] [id number]. Topic: General - Other >> Aug 19, 2018 10:41 AM Rainey Pines A wrote: Patient would like a callback regarding the scheduling for her echocardiogram.

## 2018-08-19 NOTE — Telephone Encounter (Signed)
Pt never picked up Rx from 08/05/18. Called pharmacy to get her scripts ready for pick up. Nothing further needed.

## 2018-08-19 NOTE — Telephone Encounter (Signed)
Medication Refill - Medication: alum & mag hydroxide-simeth (MYLANTA) [phone] MG/5ML suspension, omeprazole (PRILOSEC) 20 MG capsule,amLODipine (NORVASC) 5 MG tablet ,metoprolol tartrate (LOPRESSOR) 25 MG tablet (Patient stated that medication was sent to the wrong pharmacy.)      Has the patient contacted their pharmacy?Yes (Agent: If no, request that the patient contact the pharmacy for the refill.) (Agent: If yes, when and what did the pharmacy advise?)Contact PCP  Preferred Pharmacy (with phone number or street name):  [facility] [phone] - [place], [place] - [address] [phone] (Phone) [phone] (Fax)     Agent: Please be advised that RX refills may take up to 3 business days. We ask that you follow-up with your pharmacy.

## 2018-08-19 NOTE — Telephone Encounter (Signed)
Pt called back very upset that she had not heard back. I can see the doctor has put the order in. I have called [facility] and got the pt scheduled for  Mon. July 13 arrival time 1:30 pm.  Pt is aware.  Also, pt was upset her bp medicine had not been refilled from her visit the other day. However, her meds were filled 6/23 and are still at the pharmacy.  I have called the pharmacy to get her scripts ready for pick up.  Pt is very happy and nothing further needed.

## 2018-08-25 ENCOUNTER — Other Ambulatory Visit: Payer: Self-pay

## 2018-08-25 ENCOUNTER — Ambulatory Visit (HOSPITAL_COMMUNITY): Payer: Self-pay | Attending: Internal Medicine

## 2018-08-25 DIAGNOSIS — I5032 Chronic diastolic (congestive) heart failure: Secondary | ICD-10-CM

## 2018-08-25 DIAGNOSIS — I158 Other secondary hypertension: Secondary | ICD-10-CM

## 2018-08-25 MED ORDER — PERFLUTREN LIPID MICROSPHERE
1.0000 mL | INTRAVENOUS | Status: AC | PRN
  Administered 2018-08-25: 1 mL via INTRAVENOUS

## 2018-08-28 ENCOUNTER — Telehealth: Payer: Self-pay

## 2018-08-28 NOTE — Telephone Encounter (Signed)
Note from Dr. Jerilee Hoh read to pt regarding ECHO results and return to work.    Pt states she does need restrictions listed: requesting note sent that pt will have to "Leave line to take medication and also to have BP checked on occasion.  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  Also states she does not need refills.  States she will CB with workplace FAX number. Also states she can come in for BP check 09/22/2018. Pts CB# [phone]

## 2018-08-28 NOTE — Telephone Encounter (Signed)
Left message on machine for patient returning her call. There is a CRM.

## 2018-08-28 NOTE — Telephone Encounter (Signed)
Left message on machine for patient to return our call:  1. Does she need a refill of BP meds? 2. Per Dr Jerilee Hoh - ECHO was normal she may have a letter to return to work with no restrictions 3. Patient needs to schedule a follow up visit for BP.   CRM

## 2018-08-28 NOTE — Telephone Encounter (Signed)
Copied from [place] [phone]. Topic: General - Other >> Aug 28, 2018  9:27 AM Keene Breath wrote: Reason for CRM: Patient called to request a call back from the nurse or doctor regarding her return to work and her BP medication.  Please call patient as soon as possible.  CB# [phone]

## 2018-08-28 NOTE — Telephone Encounter (Signed)
Pt requesting call back from [place]. Please advise. CB#[phone]

## 2018-08-29 NOTE — Telephone Encounter (Signed)
No need to leave work to check BP or take BP meds. She can take her meds before work.

## 2018-08-29 NOTE — Telephone Encounter (Signed)
Form with letter faxed and confirmed

## 2018-08-29 NOTE — Telephone Encounter (Signed)
Patient already gave form from work at office visit.  Okay to add "Leave line to take medication and also to have BP checked on occasion."?

## 2018-08-29 NOTE — Telephone Encounter (Signed)
Patient called in with fax number for workplace: [phone].

## 2018-08-29 NOTE — Telephone Encounter (Signed)
Per policy we cannot fax any information to outside parties without a signed release, unless it is a completed form with the number already on it.

## 2018-09-01 NOTE — Telephone Encounter (Signed)
Pt called stating she needs these forms for unemployment as well. Please advise.   Fax # for unemployment. [phone]

## 2018-09-03 ENCOUNTER — Ambulatory Visit: Payer: Self-pay | Admitting: *Deleted

## 2018-09-03 NOTE — Telephone Encounter (Signed)
High b/p 150/104 p. 92 bpm now with office nurse. Earlier b/p 156/98 p.118. Earlier she had dizziness-no vertigo and feeling fatigued. Denies vision changes/one-sided weakness/CP. Had a mild headache yesterday. At this time, the dizziness and fatigue has resolved while in the nurses office. She takes norvasc and lopressor 25 mg tab in the am. And Lopressor again in the evenings, reports she took the evening dose of metoprolol at 2:40p today. She has ongoing ankle edema she reported. She is up walking in the office at this time with the nurse. Refused appointment for tomorrow instead requesting one for Friday. Reviewed urgent symptoms requiring immediate evaluation-stated she understood. Warm transferred for appointment.  Reason for Disposition . Systolic BP  >= 329 OR Diastolic >= 518  Answer Assessment - Initial Assessment Questions 1. BLOOD PRESSURE: "What is the blood pressure?" "Did you take at least two measurements 5 minutes apart?"     154/99 2. ONSET: "When did you take your blood pressure?"     now 3. HOW: "How did you obtain the blood pressure?" (e.g., visiting nurse, automatic home BP monitor)    Work nurse 4. HISTORY: "Do you have a history of high blood pressure?"     yes 5. MEDICATIONS: "Are you taking any medications for blood pressure?" "Have you missed any doses recently?"yes     6. OTHER SYMPTOMS: "Do you have any symptoms?" (e.g., headache, chest pain, blurred vision, difficulty breathing, weakness)     Weakness and shortness of breath 7. PREGNANCY: "Is there any chance you are pregnant?" "When was your last menstrual period?"     no  Protocols used: HIGH BLOOD PRESSURE-A-AH

## 2018-09-03 NOTE — Telephone Encounter (Signed)
Patient has an appointment on 09/05/2018

## 2018-09-03 NOTE — Telephone Encounter (Signed)
Forms/letter faxed and confirmed.

## 2018-09-05 ENCOUNTER — Other Ambulatory Visit: Payer: Self-pay

## 2018-09-05 ENCOUNTER — Encounter: Payer: Self-pay | Admitting: Internal Medicine

## 2018-09-05 ENCOUNTER — Ambulatory Visit (INDEPENDENT_AMBULATORY_CARE_PROVIDER_SITE_OTHER): Payer: Self-pay | Admitting: Internal Medicine

## 2018-09-05 VITALS — BP 150/100 | HR 91 | Temp 98.5°F | Wt 178.4 lb

## 2018-09-05 DIAGNOSIS — I10 Essential (primary) hypertension: Secondary | ICD-10-CM

## 2018-09-05 MED ORDER — AMLODIPINE BESYLATE 10 MG PO TABS: 10.0000 mg | ORAL_TABLET | Freq: Every day | ORAL | 1 refills | Status: DC

## 2018-09-05 NOTE — Patient Instructions (Signed)
-Nice seeing you today!!  -Increase amlodipine (norvasc) to 10 mg daily.  -Schedule follow up in 4-6 weeks.  -Low sodium diet.   DASH Eating Plan DASH stands for "Dietary Approaches to Stop Hypertension." The DASH eating plan is a healthy eating plan that has been shown to reduce high blood pressure (hypertension). It may also reduce your risk for type 2 diabetes, heart disease, and stroke. The DASH eating plan may also help with weight loss. What are tips for following this plan?  General guidelines  Avoid eating more than 2,300 mg (milligrams) of salt (sodium) a day. If you have hypertension, you may need to reduce your sodium intake to 1,500 mg a day.  Limit alcohol intake to no more than 1 drink a day for nonpregnant women and 2 drinks a day for men. One drink equals 12 oz of beer, 5 oz of wine, or 1 oz of hard liquor.  Work with your health care provider to maintain a healthy body weight or to lose weight. Ask what an ideal weight is for you.  Get at least 30 minutes of exercise that causes your heart to beat faster (aerobic exercise) most days of the week. Activities may include walking, swimming, or biking.  Work with your health care provider or diet and nutrition specialist (dietitian) to adjust your eating plan to your individual calorie needs. Reading food labels   Check food labels for the amount of sodium per serving. Choose foods with less than 5 percent of the Daily Value of sodium. Generally, foods with less than 300 mg of sodium per serving fit into this eating plan.  To find whole grains, look for the word "whole" as the first word in the ingredient list. Shopping  Buy products labeled as "low-sodium" or "no salt added."  Buy fresh foods. Avoid canned foods and premade or frozen meals. Cooking  Avoid adding salt when cooking. Use salt-free seasonings or herbs instead of table salt or sea salt. Check with your health care provider or pharmacist before using salt  substitutes.  Do not fry foods. Cook foods using healthy methods such as baking, boiling, grilling, and broiling instead.  Cook with heart-healthy oils, such as olive, canola, soybean, or sunflower oil. Meal planning  Eat a balanced diet that includes: ? 5 or more servings of fruits and vegetables each day. At each meal, try to fill half of your plate with fruits and vegetables. ? Up to 6-8 servings of whole grains each day. ? Less than 6 oz of lean meat, poultry, or fish each day. A 3-oz serving of meat is about the same size as a deck of cards. One egg equals 1 oz. ? 2 servings of low-fat dairy each day. ? A serving of nuts, seeds, or beans 5 times each week. ? Heart-healthy fats. Healthy fats called Omega-3 fatty acids are found in foods such as flaxseeds and coldwater fish, like sardines, salmon, and mackerel.  Limit how much you eat of the following: ? Canned or prepackaged foods. ? Food that is high in trans fat, such as fried foods. ? Food that is high in saturated fat, such as fatty meat. ? Sweets, desserts, sugary drinks, and other foods with added sugar. ? Full-fat dairy products.  Do not salt foods before eating.  Try to eat at least 2 vegetarian meals each week.  Eat more home-cooked food and less restaurant, buffet, and fast food.  When eating at a restaurant, ask that your food be prepared with  less salt or no salt, if possible. What foods are recommended? The items listed may not be a complete list. Talk with your dietitian about what dietary choices are best for you. Grains Whole-grain or whole-wheat bread. Whole-grain or whole-wheat pasta. Brown rice. [place]. Bulgur. Whole-grain and low-sodium cereals. Pita bread. Low-fat, low-sodium crackers. Whole-wheat flour tortillas. Vegetables Fresh or frozen vegetables (raw, steamed, roasted, or grilled). Low-sodium or reduced-sodium tomato and vegetable juice. Low-sodium or reduced-sodium tomato sauce and tomato  paste. Low-sodium or reduced-sodium canned vegetables. Fruits All fresh, dried, or frozen fruit. Canned fruit in natural juice (without added sugar). Meat and other protein foods Skinless chicken or [place]. Ground chicken or [place]. Pork with fat trimmed off. Fish and seafood. Egg whites. Dried beans, peas, or lentils. Unsalted nuts, nut butters, and seeds. Unsalted canned beans. Lean cuts of beef with fat trimmed off. Low-sodium, lean deli meat. Dairy Low-fat (1%) or fat-free (skim) milk. Fat-free, low-fat, or reduced-fat cheeses. Nonfat, low-sodium ricotta or cottage cheese. Low-fat or nonfat yogurt. Low-fat, low-sodium cheese. Fats and oils Soft margarine without trans fats. Vegetable oil. Low-fat, reduced-fat, or light mayonnaise and salad dressings (reduced-sodium). Canola, safflower, olive, soybean, and sunflower oils. Avocado. Seasoning and other foods Herbs. Spices. Seasoning mixes without salt. Unsalted popcorn and pretzels. Fat-free sweets. What foods are not recommended? The items listed may not be a complete list. Talk with your dietitian about what dietary choices are best for you. Grains Baked goods made with fat, such as croissants, muffins, or some breads. Dry pasta or rice meal packs. Vegetables Creamed or fried vegetables. Vegetables in a cheese sauce. Regular canned vegetables (not low-sodium or reduced-sodium). Regular canned tomato sauce and paste (not low-sodium or reduced-sodium). Regular tomato and vegetable juice (not low-sodium or reduced-sodium). Angie Fava. Olives. Fruits Canned fruit in a light or heavy syrup. Fried fruit. Fruit in cream or butter sauce. Meat and other protein foods Fatty cuts of meat. Ribs. Fried meat. Berniece Salines. Sausage. Bologna and other processed lunch meats. Salami. Fatback. Hotdogs. Bratwurst. Salted nuts and seeds. Canned beans with added salt. Canned or smoked fish. Whole eggs or egg yolks. Chicken or [place] with skin. Dairy Whole or 2% milk,  cream, and half-and-half. Whole or full-fat cream cheese. Whole-fat or sweetened yogurt. Full-fat cheese. Nondairy creamers. Whipped toppings. Processed cheese and cheese spreads. Fats and oils Butter. Stick margarine. Lard. Shortening. Ghee. Bacon fat. Tropical oils, such as coconut, palm kernel, or palm oil. Seasoning and other foods Salted popcorn and pretzels. Onion salt, garlic salt, seasoned salt, table salt, and sea salt. Worcestershire sauce. Tartar sauce. Barbecue sauce. Teriyaki sauce. Soy sauce, including reduced-sodium. Steak sauce. Canned and packaged gravies. Fish sauce. Oyster sauce. Cocktail sauce. Horseradish that you find on the shelf. Ketchup. Mustard. Meat flavorings and tenderizers. Bouillon cubes. Hot sauce and Tabasco sauce. Premade or packaged marinades. Premade or packaged taco seasonings. Relishes. Regular salad dressings. Where to find more information:  National Heart, Lung, and [place]: https://wilson-eaton.com/  American Heart Association: www.heart.org Summary  The DASH eating plan is a healthy eating plan that has been shown to reduce high blood pressure (hypertension). It may also reduce your risk for type 2 diabetes, heart disease, and stroke.  With the DASH eating plan, you should limit salt (sodium) intake to 2,300 mg a day. If you have hypertension, you may need to reduce your sodium intake to 1,500 mg a day.  When on the DASH eating plan, aim to eat more fresh fruits and vegetables, whole grains, lean proteins,  low-fat dairy, and heart-healthy fats.  Work with your health care provider or diet and nutrition specialist (dietitian) to adjust your eating plan to your individual calorie needs. This information is not intended to replace advice given to you by your health care provider. Make sure you discuss any questions you have with your health care provider. Document Released: 01/18/2011 Document Revised: 01/11/2017 Document Reviewed: 01/23/2016 Elsevier  Patient Education  2020 [employer].

## 2018-09-05 NOTE — Progress Notes (Signed)
Established Patient Office Visit     CC/Reason for Visit: BP Follow up  HPI: Katherine [name] is a 53 y.o. female who is coming in today for the above mentioned reasons.  She was diagnosed with hypertension last visit and metoprolol 25 mg twice daily was added to Norvasc 5 mg.  She has noted improvement in her blood pressure although it is not yet back to normal.  She still feels like her heart races at times when she is at work, however the highest it has ever been per her report is 100.  2D echo was done which showed: Ejection fraction of 55 to 60% with mildly dilated cavity size, normal RV function.   Past Medical/Surgical History: Past Medical History:  Diagnosis Date  . Asthma   . Asthma   . Bronchitis   . CHF (congestive heart failure) ([place])   . Coronary artery disease    MI 2007  . Hypertension     Past Surgical History:  Procedure Laterality Date  . ABDOMINAL HYSTERECTOMY    . HERNIA REPAIR    . TUBAL LIGATION    . TUBAL LIGATION     R only    Social History:  reports that she has quit smoking. Her smoking use included cigarettes. She has never used smokeless tobacco. She reports current alcohol use. She reports that she does not use drugs.  Allergies: No Known Allergies  Family History:  Family History  Problem Relation Age of Onset  . Coronary artery disease Other   . Diabetes Mother   . Hypertension Mother   . Heart failure Mother      Current Outpatient Medications:  .  albuterol (PROVENTIL HFA;VENTOLIN HFA) 108 (90 BASE) MCG/ACT inhaler, Inhale 2 puffs into the lungs every 6 (six) hours as needed for wheezing., Disp: 1 Inhaler, Rfl: 2 .  alum & mag hydroxide-simeth (MYLANTA) 200-200-20 MG/5ML suspension, Take 15 mLs by mouth every 6 (six) hours as needed for indigestion or heartburn., Disp: 355 mL, Rfl: 1 .  amLODipine (NORVASC) 10 MG tablet, Take 1 tablet (10 mg total) by mouth daily., Disp: 90 tablet, Rfl: 1 .  aspirin EC 81 MG tablet, Take 1  tablet (81 mg total) by mouth daily., Disp: 30 tablet, Rfl: 0 .  chlorhexidine (PERIDEX) 0.12 % solution, Use as directed 15 mLs in the mouth or throat 2 (two) times daily., Disp: 120 mL, Rfl: 0 .  fluticasone (FLONASE) 50 MCG/ACT nasal spray, Place 1 spray into both nostrils daily., Disp: 9.9 g, Rfl: 2 .  ibuprofen (ADVIL,MOTRIN) 800 MG tablet, Take 1 tablet (800 mg total) by mouth 3 (three) times daily., Disp: 21 tablet, Rfl: 0 .  metoprolol tartrate (LOPRESSOR) 25 MG tablet, Take 1 tablet (25 mg total) by mouth 2 (two) times daily., Disp: 180 tablet, Rfl: 1 .  omeprazole (PRILOSEC) 20 MG capsule, Take 1 capsule (20 mg total) by mouth daily., Disp: 30 capsule, Rfl: 1  Review of Systems:  Constitutional: Denies fever, chills, diaphoresis, appetite change and fatigue.  HEENT: Denies photophobia, eye pain, redness, hearing loss, ear pain, congestion, sore throat, rhinorrhea, sneezing, mouth sores, trouble swallowing, neck pain, neck stiffness and tinnitus.   Respiratory: Denies SOB, DOE, cough, chest tightness,  and wheezing.   Cardiovascular: Denies chest pain, palpitations and leg swelling.  Gastrointestinal: Denies nausea, vomiting, abdominal pain, diarrhea, constipation, blood in stool and abdominal distention.  Genitourinary: Denies dysuria, urgency, frequency, hematuria, flank pain and difficulty urinating.  Endocrine: Denies:  hot or cold intolerance, sweats, changes in hair or nails, polyuria, polydipsia. Musculoskeletal: Denies myalgias, back pain, joint swelling, arthralgias and gait problem.  Skin: Denies pallor, rash and wound.  Neurological: Denies dizziness, seizures, syncope, weakness, light-headedness, numbness and headaches.  Hematological: Denies adenopathy. Easy bruising, personal or family bleeding history  Psychiatric/Behavioral: Denies suicidal ideation, mood changes, confusion, nervousness, sleep disturbance and agitation    Physical Exam: Vitals:   09/05/18 0957  BP:  (!) 150/100  Pulse: 91  Temp: 98.5 F (36.9 C)  TempSrc: Oral  SpO2: 94%  Weight: 178 lb 6.4 oz (80.9 kg)    Body mass index is 28.79 kg/m.   Constitutional: NAD, calm, comfortable Eyes: PERRL, lids and conjunctivae normal ENMT: Mucous membranes are moist. Respiratory: clear to auscultation bilaterally, no wheezing, no crackles. Normal respiratory effort. No accessory muscle use.  Cardiovascular: Regular rate and rhythm, no murmurs / rubs / gallops. No extremity edema. 2+ pedal pulses. No carotid bruits.   Musculoskeletal: no clubbing / cyanosis. No joint deformity upper and lower extremities. Good ROM, no contractures. Normal muscle tone.  Psychiatric: Normal judgment and insight. Alert and oriented x 3. Normal mood.    Impression and Plan:  Essential hypertension  -Blood pressure remains above goal although improved. -Increase Norvasc from 5 to 10 mg, continue metoprolol 25 mg twice daily. -Will return in 4 to 6 weeks for blood pressure follow-up, consider adding a diuretic at that time.    Patient Instructions  -Nice seeing you today!!  -Increase amlodipine (norvasc) to 10 mg daily.  -Schedule follow up in 4-6 weeks.  -Low sodium diet.   DASH Eating Plan DASH stands for "Dietary Approaches to Stop Hypertension." The DASH eating plan is a healthy eating plan that has been shown to reduce high blood pressure (hypertension). It may also reduce your risk for type 2 diabetes, heart disease, and stroke. The DASH eating plan may also help with weight loss. What are tips for following this plan?  General guidelines  Avoid eating more than 2,300 mg (milligrams) of salt (sodium) a day. If you have hypertension, you may need to reduce your sodium intake to 1,500 mg a day.  Limit alcohol intake to no more than 1 drink a day for nonpregnant women and 2 drinks a day for men. One drink equals 12 oz of beer, 5 oz of wine, or 1 oz of hard liquor.  Work with your health care  provider to maintain a healthy body weight or to lose weight. Ask what an ideal weight is for you.  Get at least 30 minutes of exercise that causes your heart to beat faster (aerobic exercise) most days of the week. Activities may include walking, swimming, or biking.  Work with your health care provider or diet and nutrition specialist (dietitian) to adjust your eating plan to your individual calorie needs. Reading food labels   Check food labels for the amount of sodium per serving. Choose foods with less than 5 percent of the Daily Value of sodium. Generally, foods with less than 300 mg of sodium per serving fit into this eating plan.  To find whole grains, look for the word "whole" as the first word in the ingredient list. Shopping  Buy products labeled as "low-sodium" or "no salt added."  Buy fresh foods. Avoid canned foods and premade or frozen meals. Cooking  Avoid adding salt when cooking. Use salt-free seasonings or herbs instead of table salt or sea salt. Check with your health care provider  or pharmacist before using salt substitutes.  Do not fry foods. Cook foods using healthy methods such as baking, boiling, grilling, and broiling instead.  Cook with heart-healthy oils, such as olive, canola, soybean, or sunflower oil. Meal planning  Eat a balanced diet that includes: ? 5 or more servings of fruits and vegetables each day. At each meal, try to fill half of your plate with fruits and vegetables. ? Up to 6-8 servings of whole grains each day. ? Less than 6 oz of lean meat, poultry, or fish each day. A 3-oz serving of meat is about the same size as a deck of cards. One egg equals 1 oz. ? 2 servings of low-fat dairy each day. ? A serving of nuts, seeds, or beans 5 times each week. ? Heart-healthy fats. Healthy fats called Omega-3 fatty acids are found in foods such as flaxseeds and coldwater fish, like sardines, salmon, and mackerel.  Limit how much you eat of the following:  ? Canned or prepackaged foods. ? Food that is high in trans fat, such as fried foods. ? Food that is high in saturated fat, such as fatty meat. ? Sweets, desserts, sugary drinks, and other foods with added sugar. ? Full-fat dairy products.  Do not salt foods before eating.  Try to eat at least 2 vegetarian meals each week.  Eat more home-cooked food and less restaurant, buffet, and fast food.  When eating at a restaurant, ask that your food be prepared with less salt or no salt, if possible. What foods are recommended? The items listed may not be a complete list. Talk with your dietitian about what dietary choices are best for you. Grains Whole-grain or whole-wheat bread. Whole-grain or whole-wheat pasta. Brown rice. Orpah Cobbatmeal. Quinoa. Bulgur. Whole-grain and low-sodium cereals. Pita bread. Low-fat, low-sodium crackers. Whole-wheat flour tortillas. Vegetables Fresh or frozen vegetables (raw, steamed, roasted, or grilled). Low-sodium or reduced-sodium tomato and vegetable juice. Low-sodium or reduced-sodium tomato sauce and tomato paste. Low-sodium or reduced-sodium canned vegetables. Fruits All fresh, dried, or frozen fruit. Canned fruit in natural juice (without added sugar). Meat and other protein foods Skinless chicken or Malawiturkey. Ground chicken or Mal[place]iturkey. Pork with fat trimmed off. Fish and seafood. Egg whites. Dried beans, peas, or lentils. Unsalted nuts, nut butters, and seeds. Unsalted canned beans. Lean cuts of beef with fat trimmed off. Low-sodium, lean deli meat. Dairy Low-fat (1%) or fat-free (skim) milk. Fat-free, low-fat, or reduced-fat cheeses. Nonfat, low-sodium ricotta or cottage cheese. Low-fat or nonfat yogurt. Low-fat, low-sodium cheese. Fats and oils Soft margarine without trans fats. Vegetable oil. Low-fat, reduced-fat, or light mayonnaise and salad dressings (reduced-sodium). Canola, safflower, olive, soybean, and sunflower oils. Avocado. Seasoning and other foods  Herbs. Spices. Seasoning mixes without salt. Unsalted popcorn and pretzels. Fat-free sweets. What foods are not recommended? The items listed may not be a complete list. Talk with your dietitian about what dietary choices are best for you. Grains Baked goods made with fat, such as croissants, muffins, or some breads. Dry pasta or rice meal packs. Vegetables Creamed or fried vegetables. Vegetables in a cheese sauce. Regular canned vegetables (not low-sodium or reduced-sodium). Regular canned tomato sauce and paste (not low-sodium or reduced-sodium). Regular tomato and vegetable juice (not low-sodium or reduced-sodium). Rosita FirePickles. Olives. Fruits Canned fruit in a light or heavy syrup. Fried fruit. Fruit in cream or butter sauce. Meat and other protein foods Fatty cuts of meat. Ribs. Fried meat. Tomasa BlaseBacon. Sausage. Bologna and other processed lunch meats. Salami. Fatback. Hotdogs. Bratwurst.  Salted nuts and seeds. Canned beans with added salt. Canned or smoked fish. Whole eggs or egg yolks. Chicken or Malawiturkey with skin. Dairy Whole or 2% milk, cream, and half-and-half. Whole or full-fat cream cheese. Whole-fat or sweetened yogurt. Full-fat cheese. Nondairy creamers. Whipped toppings. Processed cheese and cheese spreads. Fats and oils Butter. Stick margarine. Lard. Shortening. Ghee. Bacon fat. Tropical oils, such as coconut, palm kernel, or palm oil. Seasoning and other foods Salted popcorn and pretzels. Onion salt, garlic salt, seasoned salt, table salt, and sea salt. Worcestershire sauce. Tartar sauce. Barbecue sauce. Teriyaki sauce. Soy sauce, including reduced-sodium. Steak sauce. Canned and packaged gravies. Fish sauce. Oyster sauce. Cocktail sauce. Horseradish that you find on the shelf. Ketchup. Mustard. Meat flavorings and tenderizers. Bouillon cubes. Hot sauce and Tabasco sauce. Premade or packaged marinades. Premade or packaged taco seasonings. Relishes. Regular salad dressings. Where to find more  information:  National Heart, Lung, and Blood Institute: PopSteam.iswww.nhlbi.nih.gov  American Heart Association: www.heart.org Summary  The DASH eating plan is a healthy eating plan that has been shown to reduce high blood pressure (hypertension). It may also reduce your risk for type 2 diabetes, heart disease, and stroke.  With the DASH eating plan, you should limit salt (sodium) intake to 2,300 mg a day. If you have hypertension, you may need to reduce your sodium intake to 1,500 mg a day.  When on the DASH eating plan, aim to eat more fresh fruits and vegetables, whole grains, lean proteins, low-fat dairy, and heart-healthy fats.  Work with your health care provider or diet and nutrition specialist (dietitian) to adjust your eating plan to your individual calorie needs. This information is not intended to replace advice given to you by your health care provider. Make sure you discuss any questions you have with your health care provider. Document Released: 01/18/2011 Document Revised: 01/11/2017 Document Reviewed: 01/23/2016 Elsevier Patient Education  2020 Elsevier Inc.      Chaya JanEstela Hernandez Acosta, MD [facility]

## 2018-10-06 ENCOUNTER — Emergency Department (HOSPITAL_COMMUNITY): Payer: Self-pay

## 2018-10-06 ENCOUNTER — Encounter (HOSPITAL_COMMUNITY): Payer: Self-pay

## 2018-10-06 ENCOUNTER — Other Ambulatory Visit: Payer: Self-pay

## 2018-10-06 ENCOUNTER — Emergency Department (HOSPITAL_COMMUNITY)
Admission: EM | Admit: 2018-10-06 | Discharge: 2018-10-06 | Disposition: A | Discharge status: Home / Self Care | Payer: Self-pay | Attending: Emergency Medicine | Admitting: Emergency Medicine

## 2018-10-06 DIAGNOSIS — Z7982 Long term (current) use of aspirin: Secondary | ICD-10-CM | POA: Insufficient documentation

## 2018-10-06 DIAGNOSIS — M546 Pain in thoracic spine: Secondary | ICD-10-CM | POA: Insufficient documentation

## 2018-10-06 DIAGNOSIS — I110 Hypertensive heart disease with heart failure: Secondary | ICD-10-CM | POA: Insufficient documentation

## 2018-10-06 DIAGNOSIS — I2510 Atherosclerotic heart disease of native coronary artery without angina pectoris: Secondary | ICD-10-CM | POA: Insufficient documentation

## 2018-10-06 DIAGNOSIS — Z87891 Personal history of nicotine dependence: Secondary | ICD-10-CM | POA: Insufficient documentation

## 2018-10-06 DIAGNOSIS — M545 Low back pain, unspecified: Secondary | ICD-10-CM

## 2018-10-06 DIAGNOSIS — W19XXXA Unspecified fall, initial encounter: Secondary | ICD-10-CM

## 2018-10-06 DIAGNOSIS — J45909 Unspecified asthma, uncomplicated: Secondary | ICD-10-CM | POA: Insufficient documentation

## 2018-10-06 DIAGNOSIS — I509 Heart failure, unspecified: Secondary | ICD-10-CM | POA: Insufficient documentation

## 2018-10-06 DIAGNOSIS — Z79899 Other long term (current) drug therapy: Secondary | ICD-10-CM | POA: Insufficient documentation

## 2018-10-06 MED ORDER — CYCLOBENZAPRINE HCL 10 MG PO TABS
10.0000 mg | ORAL_TABLET | Freq: Once | ORAL | Status: AC
  Administered 2018-10-06: 10 mg via ORAL
  Filled 2018-10-06: qty 1

## 2018-10-06 MED ORDER — DICLOFENAC SODIUM 1 % TD GEL: 2.0000 g | Freq: Four times a day (QID) | TRANSDERMAL | 0 refills | Status: DC

## 2018-10-06 MED ORDER — METHOCARBAMOL 500 MG PO TABS: 1000.0000 mg | ORAL_TABLET | Freq: Three times a day (TID) | ORAL | 0 refills | Status: AC

## 2018-10-06 MED ORDER — NAPROXEN 500 MG PO TABS: 500.0000 mg | ORAL_TABLET | Freq: Two times a day (BID) | ORAL | 0 refills | Status: DC

## 2018-10-06 MED ORDER — IBUPROFEN 400 MG PO TABS
400.0000 mg | ORAL_TABLET | Freq: Once | ORAL | Status: AC
  Administered 2018-10-06: 400 mg via ORAL
  Filled 2018-10-06: qty 1

## 2018-10-06 MED ORDER — ACETAMINOPHEN 500 MG PO TABS
1000.0000 mg | ORAL_TABLET | Freq: Once | ORAL | Status: AC
  Administered 2018-10-06: 1000 mg via ORAL
  Filled 2018-10-06: qty 2
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
## 2018-10-06 NOTE — ED Notes (Signed)
Patient verbalizes understanding of discharge instructions. Opportunity for questioning and answers were provided. Armband removed by staff, pt discharged from ED.  

## 2018-10-06 NOTE — ED Notes (Signed)
Patient transported to X-ray 
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
## 2018-10-06 NOTE — Discharge Instructions (Signed)
You you were seen in the emergency department for back pain after a fall   X-rays of thoracic and lumbar spine are normal did not show any traumatic injuries from today  I suspect your pain is either from a muscular spasm, strain or contusion.  We will treat this with a combination of anti-inflammatories, muscle relaxers, stretches, rest. [phone] mg of acetaminophen (Tylenol) every 6 hours for the next 3-5 days. Do not exceed more than 4,000 mg acetaminophen in a 24 hour period. Do not take acetaminophen if you have liver disease.  500 mg of naproxen every 12 hours for the next 3-5 days / 600 mg ibuprofen every 6-8 hours for the next 3-5 days. Do not exceed 1,000 mg naproxen in a 24 hour period. Do not take naproxen if you are pregnant, have history of kidney disease, ulcers, GI bleeding, severe acid reflux, or take a blood thinner.   [phone] mg methocarbamol every 8 hours for the next 3-5 days for associated spasms and tightness Over-the-counter lidocaine-containing patches can be applied to the area of pain for 12 hours at a time Apply/rub diclofenac sodium gel on muscles until it feels dry every 6-8 hours, then can use heating pad for 20-30 mins to help absorb Rest for the next 48 hours to avoid further injury Transition back into daily activity slowly to avoid reinjury  Symptoms should improve over the next 48-72 hours and slowly resolve over a course of 7-10 days   Follow up with primary care doctor in 7-10 days if symptoms not improving   Return to the ER if your pain worsens, you develop abdominal pain, urinary symptoms, changes to your bowel movements, numbness in your groin, loss of bladder or bowel control, loss of sensation or heaviness or weakness to your extremities, fevers

## 2018-10-09 ENCOUNTER — Telehealth: Payer: Self-pay | Admitting: *Deleted

## 2018-10-09 NOTE — Telephone Encounter (Signed)
Left detailed message on machine with Dr Hernandez's recommendation.  
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
## 2018-10-15 ENCOUNTER — Inpatient Hospital Stay: Payer: Medicaid Other | Admitting: Internal Medicine

## 2018-10-15 ENCOUNTER — Telehealth: Payer: Self-pay | Admitting: *Deleted

## 2018-10-15 NOTE — Telephone Encounter (Signed)
Copied from [place] [id number]. Topic: Appointment Scheduling - Scheduling Inquiry for Clinic >> Oct 15, 2018  8:58 AM Reyne Dumas L wrote: Reason for CRM:   Pt missed the bus today for her hospital follow up.  Rescheduled to next available at end of month.  Pt wants to know if doctor would consider seeing her sooner.

## 2018-10-15 NOTE — Telephone Encounter (Signed)
Attempted to call patient but voicemail is full.  Dr Jerilee Hoh can not write a note to return to work.  This should be written by the provider who treated her.  If she would like a virtual visit, Dr Jerilee Hoh has openings.  CRM

## 2018-10-17 ENCOUNTER — Other Ambulatory Visit: Payer: Self-pay

## 2018-10-17 ENCOUNTER — Inpatient Hospital Stay: Payer: Self-pay | Admitting: Internal Medicine

## 2018-11-11 ENCOUNTER — Inpatient Hospital Stay: Payer: Medicaid Other | Admitting: Internal Medicine

## 2019-05-07 ENCOUNTER — Ambulatory Visit: Payer: Medicaid Other | Attending: Internal Medicine

## 2019-05-07 DIAGNOSIS — Z23 Encounter for immunization: Secondary | ICD-10-CM

## 2019-05-07 NOTE — Progress Notes (Signed)
   Covid-19 Vaccination Clinic  Name:  Deziree Mokry    MRN: [id number] DOB: 07/01/1965  05/07/2019  Ms. Gamel was observed post Covid-19 immunization for 15 minutes without incident. She was provided with Vaccine Information Sheet and instruction to access the V-Safe system.   Ms. Budge was instructed to call 911 with any severe reactions post vaccine: Marland Kitchen Difficulty breathing  . Swelling of face and throat  . A fast heartbeat  . A bad rash all over body  . Dizziness and weakness   Immunizations Administered    Name Date Dose VIS Date Route   Pfizer COVID-19 Vaccine 05/07/2019  8:23 AM 0.3 mL 01/23/2019 Intramuscular   Manufacturer: [employer], [employer]   Lot: [id number]   NDC: [address]-7

## 2019-06-01 ENCOUNTER — Ambulatory Visit: Payer: Medicaid Other | Attending: Internal Medicine

## 2019-06-01 DIAGNOSIS — Z23 Encounter for immunization: Secondary | ICD-10-CM

## 2019-06-01 NOTE — Progress Notes (Signed)
   Covid-19 Vaccination Clinic  Name:  Katherine [name]    MRN: [id number] DOB: 12-05-1965  06/01/2019  [name] was observed post Covid-19 immunization for 15 minutes without incident. She was provided with Vaccine Information Sheet and instruction to access the V-Safe system.   [name] was instructed to call 911 with any severe reactions post vaccine: Marland Kitchen Difficulty breathing  . Swelling of face and throat  . A fast heartbeat  . A bad rash all over body  . Dizziness and weakness   Immunizations Administered    Name Date Dose VIS Date Route   Pfizer COVID-19 Vaccine 06/01/2019  8:35 AM 0.3 mL 04/08/2018 Intramuscular   Manufacturer: [employer], [employer]   Lot: [id number]   NDC: [address]-0

## 2019-06-04 ENCOUNTER — Other Ambulatory Visit: Payer: Self-pay

## 2019-06-04 ENCOUNTER — Encounter (HOSPITAL_COMMUNITY): Payer: Self-pay | Admitting: Emergency Medicine

## 2019-06-04 ENCOUNTER — Emergency Department (HOSPITAL_COMMUNITY)
Admission: EM | Admit: 2019-06-04 | Discharge: 2019-06-05 | Disposition: A | Discharge status: Home / Self Care | Payer: Medicaid Other | Attending: Emergency Medicine | Admitting: Emergency Medicine

## 2019-06-04 DIAGNOSIS — I2510 Atherosclerotic heart disease of native coronary artery without angina pectoris: Secondary | ICD-10-CM | POA: Insufficient documentation

## 2019-06-04 DIAGNOSIS — I110 Hypertensive heart disease with heart failure: Secondary | ICD-10-CM | POA: Insufficient documentation

## 2019-06-04 DIAGNOSIS — Z79899 Other long term (current) drug therapy: Secondary | ICD-10-CM | POA: Insufficient documentation

## 2019-06-04 DIAGNOSIS — R112 Nausea with vomiting, unspecified: Secondary | ICD-10-CM | POA: Insufficient documentation

## 2019-06-04 DIAGNOSIS — N10 Acute tubulo-interstitial nephritis: Secondary | ICD-10-CM | POA: Insufficient documentation

## 2019-06-04 DIAGNOSIS — I509 Heart failure, unspecified: Secondary | ICD-10-CM | POA: Insufficient documentation

## 2019-06-04 DIAGNOSIS — R197 Diarrhea, unspecified: Secondary | ICD-10-CM | POA: Insufficient documentation

## 2019-06-04 DIAGNOSIS — N12 Tubulo-interstitial nephritis, not specified as acute or chronic: Secondary | ICD-10-CM

## 2019-06-04 LAB — URINALYSIS, ROUTINE W REFLEX MICROSCOPIC
Bilirubin Urine: NEGATIVE
Glucose, UA: NEGATIVE mg/dL
Ketones, ur: NEGATIVE mg/dL
Nitrite: POSITIVE — AB
Protein, ur: 100 mg/dL — AB
Specific Gravity, Urine: 1.015 (ref 1.005–1.030)
WBC, UA: >50 WBC/hpf — ABNORMAL HIGH (ref 0–5)
pH: 5 (ref 5.0–8.0)

## 2019-06-04 LAB — CBC WITH DIFFERENTIAL/PLATELET
Abs Immature Granulocytes: 0.03 10*3/uL (ref 0.00–0.07)
Basophils Absolute: 0 10*3/uL (ref 0.0–0.1)
Basophils Relative: 0 %
Eosinophils Absolute: 0 10*3/uL (ref 0.0–0.5)
Eosinophils Relative: 0 %
HCT: 39.6 % (ref 36.0–46.0)
Hemoglobin: 12.7 g/dL (ref 12.0–15.0)
Immature Granulocytes: 0 %
Lymphocytes Relative: 20 %
Lymphs Abs: 1.9 10*3/uL (ref 0.7–4.0)
MCH: 30.5 pg (ref 26.0–34.0)
MCHC: 32.1 g/dL (ref 30.0–36.0)
MCV: 95.2 fL (ref 80.0–100.0)
Monocytes Absolute: 0.8 10*3/uL (ref 0.1–1.0)
Monocytes Relative: 9 %
Neutro Abs: 6.5 10*3/uL (ref 1.7–7.7)
Neutrophils Relative %: 71 %
Platelets: 311 10*3/uL (ref 150–400)
RBC: 4.16 MIL/uL (ref 3.87–5.11)
RDW: 13.4 % (ref 11.5–15.5)
WBC: 9.3 10*3/uL (ref 4.0–10.5)
nRBC: 0 % (ref 0.0–0.2)

## 2019-06-04 LAB — COMPREHENSIVE METABOLIC PANEL
ALT: 20 U/L (ref 0–44)
AST: 18 U/L (ref 15–41)
Albumin: 3.4 g/dL — ABNORMAL LOW (ref 3.5–5.0)
Alkaline Phosphatase: 72 U/L (ref 38–126)
Anion gap: 12 (ref 5–15)
BUN: 5 mg/dL — ABNORMAL LOW (ref 6–20)
CO2: 25 mmol/L (ref 22–32)
Calcium: 8.6 mg/dL — ABNORMAL LOW (ref 8.9–10.3)
Chloride: 99 mmol/L (ref 98–111)
Creatinine, Ser: 0.8 mg/dL (ref 0.44–1.00)
GFR calc Af Amer: >60 mL/min (ref >60)
GFR calc non Af Amer: >60 mL/min (ref >60)
Glucose, Bld: 109 mg/dL — ABNORMAL HIGH (ref 70–99)
Potassium: 3.2 mmol/L — ABNORMAL LOW (ref 3.5–5.1)
Sodium: 136 mmol/L (ref 135–145)
Total Bilirubin: 1.1 mg/dL (ref 0.3–1.2)
Total Protein: 7 g/dL (ref 6.5–8.1)

## 2019-06-04 MED ORDER — ONDANSETRON 4 MG PO TBDP
4.0000 mg | ORAL_TABLET | Freq: Once | ORAL | Status: AC
  Administered 2019-06-04: 4 mg via ORAL
  Filled 2019-06-04: qty 1

## 2019-06-04 MED ORDER — ACETAMINOPHEN 325 MG PO TABS
650.0000 mg | ORAL_TABLET | Freq: Once | ORAL | Status: AC
  Administered 2019-06-04: 650 mg via ORAL
  Filled 2019-06-04: qty 2

## 2019-06-04 NOTE — ED Triage Notes (Signed)
Pt to ED with c/o abd pain, nausea, vomiting, and elevated temp.  Onset Monday night after getting her 2'nd covid vaccine.

## 2019-06-05 MED ORDER — CEPHALEXIN 250 MG PO CAPS
500.0000 mg | ORAL_CAPSULE | Freq: Once | ORAL | Status: AC
  Administered 2019-06-05: 500 mg via ORAL
  Filled 2019-06-05: qty 2

## 2019-06-05 MED ORDER — ONDANSETRON 4 MG PO TBDP: 4.0000 mg | ORAL_TABLET | Freq: Three times a day (TID) | ORAL | 0 refills | Status: AC | PRN

## 2019-06-05 MED ORDER — CEPHALEXIN 500 MG PO CAPS: 500.0000 mg | ORAL_CAPSULE | Freq: Three times a day (TID) | ORAL | 0 refills | Status: AC

## 2019-06-05 NOTE — ED Notes (Signed)
Pt tolerated PO Intake well. 
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
## 2019-06-05 NOTE — ED Notes (Signed)
Patient verbalizes understanding of discharge instructions. Opportunity for questioning and answers were provided. Armband removed by staff, pt discharged from ED ambulatory to home.  

## 2019-06-05 NOTE — ED Notes (Signed)
Pt tolerated PO intake well.

## 2019-06-07 LAB — URINE CULTURE: Culture: >=100000 — AB

## 2019-06-08 ENCOUNTER — Telehealth: Payer: Self-pay | Admitting: *Deleted

## 2019-06-08 NOTE — Telephone Encounter (Signed)
Post ED Visit - Positive Culture Follow-up  Culture report reviewed by antimicrobial stewardship pharmacist: [facility] []  , Pharm.D. []  Enzo Bi, .D., BCPS AQ-ID []  Celedonio Miyamoto, Pharm.D., BCPS []  [address], Pharm.D., BCPS []  [place], Garvin Fila.D., BCPS, AAHIVP []  , Pharm.D., BCPS, AAHIVP []  Georgina Pillion, PharmD, BCPS []  , PharmD, BCPS []  [place], PharmD, BCPS []  [place], PharmD []  , PharmD, BCPS [x]  Estella Husk, PharmD  Pharmacy Team []  Lysle Pearl, PharmD []  , PharmD []  Phillips Climes, PharmD []  , Rph []  Agapito Games) , PharmD []  Verlan Friends, PharmD []  , PharmD []  Mervyn Gay, PharmD []  , PharmD []  Margo Common, PharmD []  [facility], PharmD []  , PharmD []  Len Childs, PharmD   Positive urine culture Treated with Cephalexin, organism sensitive to the same and no further patient follow-up is required at this time.  [facility] 06/08/2019, 11:47 AM

## 2019-08-11 ENCOUNTER — Emergency Department (HOSPITAL_COMMUNITY)
Admission: EM | Admit: 2019-08-11 | Discharge: 2019-08-12 | Discharge status: Against Medical Advice | Payer: Self-pay | Attending: Emergency Medicine | Admitting: Emergency Medicine

## 2019-08-11 ENCOUNTER — Encounter (HOSPITAL_COMMUNITY): Payer: Self-pay

## 2019-08-11 DIAGNOSIS — I10 Essential (primary) hypertension: Secondary | ICD-10-CM | POA: Insufficient documentation

## 2019-08-11 DIAGNOSIS — Z5321 Procedure and treatment not carried out due to patient leaving prior to being seen by health care provider: Secondary | ICD-10-CM | POA: Insufficient documentation

## 2019-08-11 LAB — BASIC METABOLIC PANEL
Anion gap: 10 (ref 5–15)
BUN: 14 mg/dL (ref 6–20)
CO2: 26 mmol/L (ref 22–32)
Calcium: 9.3 mg/dL (ref 8.9–10.3)
Chloride: 103 mmol/L (ref 98–111)
Creatinine, Ser: 0.69 mg/dL (ref 0.44–1.00)
GFR calc Af Amer: >60 mL/min (ref >60)
GFR calc non Af Amer: >60 mL/min (ref >60)
Glucose, Bld: 138 mg/dL — ABNORMAL HIGH (ref 70–99)
Potassium: 3.1 mmol/L — ABNORMAL LOW (ref 3.5–5.1)
Sodium: 139 mmol/L (ref 135–145)

## 2019-08-11 LAB — CBC
HCT: 38.9 % (ref 36.0–46.0)
Hemoglobin: 12.9 g/dL (ref 12.0–15.0)
MCH: 30.1 pg (ref 26.0–34.0)
MCHC: 33.2 g/dL (ref 30.0–36.0)
MCV: 90.7 fL (ref 80.0–100.0)
Platelets: 290 10*3/uL (ref 150–400)
RBC: 4.29 MIL/uL (ref 3.87–5.11)
RDW: 13.5 % (ref 11.5–15.5)
WBC: 5.2 10*3/uL (ref 4.0–10.5)
nRBC: 0 % (ref 0.0–0.2)

## 2019-08-11 NOTE — ED Triage Notes (Signed)
Pt reports that she checked her BP at [facility] and it was 190 systolic, pt states her ankles have also been swollen for one month.

## 2019-08-12 ENCOUNTER — Telehealth: Payer: Self-pay | Admitting: Internal Medicine

## 2019-08-12 DIAGNOSIS — I5032 Chronic diastolic (congestive) heart failure: Secondary | ICD-10-CM

## 2019-08-12 DIAGNOSIS — I10 Essential (primary) hypertension: Secondary | ICD-10-CM

## 2019-08-12 DIAGNOSIS — I158 Other secondary hypertension: Secondary | ICD-10-CM

## 2019-08-12 MED ORDER — METOPROLOL TARTRATE 25 MG PO TABS: 25.0000 mg | ORAL_TABLET | Freq: Two times a day (BID) | ORAL | 0 refills | Status: DC

## 2019-08-12 MED ORDER — AMLODIPINE BESYLATE 10 MG PO TABS: 10.0000 mg | ORAL_TABLET | Freq: Every day | ORAL | 0 refills | Status: DC

## 2019-08-12 NOTE — Telephone Encounter (Signed)
Refills sent.  Left message on machine for patient

## 2019-08-12 NOTE — Telephone Encounter (Signed)
Left message on machine patient will need an office visit/virtual visit with a provider to review labs and possible prescription.

## 2019-08-12 NOTE — Telephone Encounter (Signed)
Pt went to the ED room last night due to high bp and swollen feet. They were able to draw blood and run test however it was going to be a 9 hr wait until they were able to get back to her. The pt had work at 5am so she had to leave. She wants to follow up with her PCP but she does not have anything for the next 2 weeks other than virtual appts. She also would like to know if someone can review her blood work from last night and if they are able to prescribe her anything until she is able to be seen. Pt stated that next Friday 7/9 works best for her because her job will be shut down that day and would like to come in to discuss with PCP about what happened-Dr. H only has a virtual spot that day-sending note back to see if it is okay to schedule hospital follow up at the 4pm virtual slot or anything virtual slot after 2:30 pm (pt gets off work at that time)?   Pt is aware that Dr. Rexene Edison is out of the office this week and ask if another doctor can review her blood work and prescribe anything that will help her until she is able to be seen?    Pt can be reach [phone] -ok to leave a detailed message per pt.

## 2019-08-12 NOTE — ED Notes (Signed)
Pt called for VS x2, no response °

## 2019-08-12 NOTE — Telephone Encounter (Signed)
Pt called in and has made a virtual appointment with Dr. Fabian Sharp for 08/21/2019 and would like to see if she can get enough BP medication until she see the provider.  Pt would like to have a call and can leave a msg if medication will be called in.

## 2019-08-13 MED ORDER — AMLODIPINE BESYLATE 10 MG PO TABS: 10.0000 mg | ORAL_TABLET | Freq: Every day | ORAL | 0 refills | Status: DC

## 2019-08-13 NOTE — Addendum Note (Signed)
Addended by: Kern Reap B on: 08/13/2019 07:59 AM   Modules accepted: Orders

## 2019-08-20 NOTE — Progress Notes (Signed)
Chief Complaint  Patient presents with  . Joint Swelling    both ankles are swelling    HPI: Katherine [name] 54 y.o. come in for SDA appt  PCP appt NA    Went to e d  6 29 2021 and here for fu had labs  And review   But didn't stay cause of  Long  wait  Has had le swelling for a while but was worse? And worried. No events  Job change  To sitting  All day  But just started this  Had been on norvasc  And restarted ? When    Lab done  In ED   Potassium was low  Sausage busicuit and ice coffee  This am   ROS: See pertinent positives and negatives per HPI. Past hx of CHF was episode of sob and edema?  When in 30s  hosp and was better in days . Saw cardiology and then ok .  Has taken goodies about weekly for back Apple cider vinegar  Detox  etoh sometimes 8 shots in a 4 hour  Time but on weekends not daily  No recent  nsaids but ? May have had aleve at some time  Past Medical History:  Diagnosis Date  . Asthma   . Asthma   . Bronchitis   . CHF (congestive heart failure) (HCC)   . Coronary artery disease    MI 2007  . Hypertension     Family History  Problem Relation Age of Onset  . Coronary artery disease Other   . Diabetes Mother   . Hypertension Mother   . Heart failure Mother     Social History   Socioeconomic History  . Marital status: Single    Spouse name: Not on file  . Number of children: Not on file  . Years of education: Not on file  . Highest education level: Not on file  Occupational History  . Not on file  Tobacco Use  . Smoking status: Former Smoker    Types: Cigarettes  . Smokeless tobacco: Never Used  Substance and Sexual Activity  . Alcohol use: Yes    Comment: occasionally  . Drug use: No  . Sexual activity: Not on file  Other Topics Concern  . Not on file  Social History Narrative   ** Merged History Encounter **       Social Determinants of Health   Financial Resource Strain:   . Difficulty of Paying Living Expenses:   Food  Insecurity:   . Worried About Programme researcher, broadcasting/film/video in the Last Year:   . Barista in the Last Year:   Transportation Needs:   . Freight forwarder (Medical):   Marland Kitchen Lack of Transportation (Non-Medical):   Physical Activity:   . Days of Exercise per Week:   . Minutes of Exercise per Session:   Stress:   . Feeling of Stress :   Social Connections:   . Frequency of Communication with Friends and Family:   . Frequency of Social Gatherings with Friends and Family:   . Attends Religious Services:   . Active Member of Clubs or Organizations:   . Attends Banker Meetings:   Marland Kitchen Marital Status:     Outpatient Medications Prior to Visit  Medication Sig Dispense Refill  . amLODipine (NORVASC) 10 MG tablet Take 1 tablet (10 mg total) by mouth daily. 30 tablet 0  . aspirin EC 81 MG tablet Take 1  tablet (81 mg total) by mouth daily. 30 tablet 0  . DERMA-SMOOTHE/FS SCALP 0.01 % OIL Apply      APPLY TO SCALP BID 2 WEEKS ON 1 WEEK OFF PRN ITCHING/FLARE    . ibuprofen (ADVIL,MOTRIN) 800 MG tablet Take 1 tablet (800 mg total) by mouth 3 (three) times daily. 21 tablet 0  . ketoconazole (NIZORAL) 2 % shampoo Shampoo with 1 a small amount as directed   2 to 3 times a week    . albuterol (PROVENTIL HFA;VENTOLIN HFA) 108 (90 BASE) MCG/ACT inhaler Inhale 2 puffs into the lungs every 6 (six) hours as needed for wheezing. (Patient not taking: Reported on 08/21/2019) 1 Inhaler 2  . alum & mag hydroxide-simeth (MYLANTA) 200-200-20 MG/5ML suspension Take 15 mLs by mouth every 6 (six) hours as needed for indigestion or heartburn. (Patient not taking: Reported on 08/21/2019) 355 mL 1  . chlorhexidine (PERIDEX) 0.12 % solution Use as directed 15 mLs in the mouth or throat 2 (two) times daily. (Patient not taking: Reported on 08/21/2019) 120 mL 0  . diclofenac sodium (VOLTAREN) 1 % GEL Apply 2 g topically 4 (four) times daily. (Patient not taking: Reported on 08/21/2019) 100 g 0  . fluticasone (FLONASE) 50  MCG/ACT nasal spray Place 1 spray into both nostrils daily. (Patient not taking: Reported on 08/21/2019) 9.9 g 2  . naproxen (NAPROSYN) 500 MG tablet Take 1 tablet (500 mg total) by mouth 2 (two) times daily. (Patient not taking: Reported on 08/21/2019) 30 tablet 0  . omeprazole (PRILOSEC) 20 MG capsule Take 1 capsule (20 mg total) by mouth daily. (Patient not taking: Reported on 08/21/2019) 30 capsule 1   No facility-administered medications prior to visit.     EXAM:  BP 126/84   Pulse 72   Temp 98.9 F (37.2 C) (Temporal)   Ht 5\' 6"  (1.676 m)   Wt 182 lb 6.4 oz (82.7 kg)   LMP 11/05/2012   SpO2 99%   BMI 29.44 kg/m   Body mass index is 29.44 kg/m. Confirmed repeat  bp 121/78 right arm  GENERAL: vitals reviewed and listed above, alert, oriented, appears well hydrated and in no acute distress HEENT: atraumatic, conjunctiva  clear, no obvious abnormalities on inspection of external nose and ears OP : masked  NECK: no obvious masses on inspection palpation  LUNGS: clear to auscultation bilaterally, no wheezes, rales or rhonchi, good air movement CV: HRRR, no clubbing cyanosis or  1-2 edema   nl cap refill  Skin left upper arm bruising  Resolving  MS: moves all extremities without noticeable focal  abnormality PSYCH: pleasant and cooperative, no obvious depression or anxiety Lab Results  Component Value Date   WBC 5.2 08/11/2019   HGB 12.9 08/11/2019   HCT 38.9 08/11/2019   PLT 290 08/11/2019   GLUCOSE 138 (H) 08/11/2019   CHOL 245 (H) 12/08/2011   TRIG 74 12/08/2011   HDL 91 12/08/2011   LDLCALC 139 (H) 12/08/2011   ALT 20 06/04/2019   AST 18 06/04/2019   NA 139 08/11/2019   K 3.1 (L) 08/11/2019   CL 103 08/11/2019   CREATININE 0.69 08/11/2019   BUN 14 08/11/2019   CO2 26 08/11/2019   TSH 0.513 12/08/2011   BP Readings from Last 3 Encounters:  08/21/19 126/84  08/11/19 (!) 142/97  06/05/19 (!) 137/91    ASSESSMENT AND PLAN:  Discussed the following assessment and  plan:  Essential hypertension - Plan: Basic metabolic panel, Hemoglobin A1c, Magnesium, Sedimentation  rate  Hypokalemia - Plan: Basic metabolic panel, Hemoglobin A1c, Magnesium, Sedimentation rate  Elevated blood sugar - random  at ed  - Plan: Basic metabolic panel, Hemoglobin A1c, Magnesium, Sedimentation rate  Back pain, unspecified back location, unspecified back pain laterality, unspecified chronicity - Plan: POCT urinalysis dipstick, Basic metabolic panel, Hemoglobin A1c, Magnesium, Sedimentation rate Many factors adding to clinical picture  Uncertain cause of low potasssium but apparently  mom also  Has low potassium  She is not officially  on a diuretic  Stop supplements  Dec soidum consider eval for hyper aldo.  bp is great today on high dose amlodipine  Back is issue for her  Now sitting job. Seems mechanical  Says always  Has blood in urine and had hx of  Renal stone uncertain significance  Agree with dec binge drink on weekend  But doubt if causing current problems  Over all needs fu for more comprehensive   Follow up as per PCP .    I think the amlodipine is adding to edema  But not the sole cause   No change in med at this time   Plan fu PCP to help with med adjustments and she will dec sodium in diet  -Patient advised to return or notify health care team  if  new concerns arise.  Patient Instructions   Avoid  Supplements for now in case.  Elevate and compression  Ok.  Repeat lab today to check  Potassium  Etc .  Amlodipine can sometimes aggravate swelling ( not dangerous and may ended to adjust dosing. )  Avoid  Oral antiinflammatories  tha can cause fluid retention.       Neta Mends. Lisl Slingerland M.D.

## 2019-08-21 ENCOUNTER — Other Ambulatory Visit: Payer: Self-pay

## 2019-08-21 ENCOUNTER — Encounter: Payer: Self-pay | Admitting: Internal Medicine

## 2019-08-21 ENCOUNTER — Ambulatory Visit (INDEPENDENT_AMBULATORY_CARE_PROVIDER_SITE_OTHER): Payer: Self-pay | Admitting: Internal Medicine

## 2019-08-21 VITALS — BP 126/84 | HR 72 | Temp 98.9°F | Ht 66.0 in | Wt 182.4 lb

## 2019-08-21 DIAGNOSIS — I10 Essential (primary) hypertension: Secondary | ICD-10-CM

## 2019-08-21 DIAGNOSIS — R739 Hyperglycemia, unspecified: Secondary | ICD-10-CM

## 2019-08-21 DIAGNOSIS — M549 Dorsalgia, unspecified: Secondary | ICD-10-CM

## 2019-08-21 DIAGNOSIS — E876 Hypokalemia: Secondary | ICD-10-CM

## 2019-08-21 LAB — POCT URINALYSIS DIPSTICK
Bilirubin, UA: POSITIVE
Blood, UA: POSITIVE
Glucose, UA: NEGATIVE
Leukocytes, UA: NEGATIVE
Nitrite, UA: NEGATIVE
Protein, UA: POSITIVE — AB
Spec Grav, UA: 1.025 (ref 1.010–1.025)
Urobilinogen, UA: 0.2 E.U./dL
pH, UA: 6 (ref 5.0–8.0)

## 2019-08-21 NOTE — Patient Instructions (Signed)
  Avoid  Supplements for now in case.  Elevate and compression  Ok.  Repeat lab today to check  Potassium  Etc .  Amlodipine can sometimes aggravate swelling ( not dangerous and may ended to adjust dosing. )  Avoid  Oral antiinflammatories  tha can cause fluid retention.

## 2019-08-22 LAB — SEDIMENTATION RATE: Sed Rate: 6 mm/h (ref 0–30)

## 2019-08-22 LAB — BASIC METABOLIC PANEL
BUN: 17 mg/dL (ref 7–25)
CO2: 27 mmol/L (ref 20–32)
Calcium: 9.8 mg/dL (ref 8.6–10.4)
Chloride: 105 mmol/L (ref 98–110)
Creat: 0.71 mg/dL (ref 0.50–1.05)
Glucose, Bld: 103 mg/dL — ABNORMAL HIGH (ref 65–99)
Potassium: 4.2 mmol/L (ref 3.5–5.3)
Sodium: 141 mmol/L (ref 135–146)

## 2019-08-22 LAB — MAGNESIUM: Magnesium: 2.1 mg/dL (ref 1.5–2.5)

## 2019-08-22 LAB — HEMOGLOBIN A1C
Hgb A1c MFr Bld: 5.6 % of total Hgb (ref <5.7)
Mean Plasma Glucose: 114 (calc)
eAG (mmol/L): 6.3 (calc)

## 2019-08-25 ENCOUNTER — Telehealth: Payer: Self-pay | Admitting: Internal Medicine

## 2019-08-25 NOTE — Progress Notes (Signed)
So the good news is that kidney function potassium and magnesium are all normal . No diabetes   and the  inflammation marker is normal .  So would still consider medication as  a potential cause or aggravator ( in addition to gravity) Consider decreasing the dose of the amlodipine to 7.5  ( but would have to order  5 mg  and 2.5 mg to take each day ( doesn't come in  7.5 )  or  better yet wait and talk with  you PCP to decide next step . Let us know  how you want to proceed

## 2019-08-25 NOTE — Telephone Encounter (Signed)
Please see message. °

## 2019-08-25 NOTE — Telephone Encounter (Signed)
See result note.  

## 2019-08-25 NOTE — Telephone Encounter (Signed)
Pt is calling in for her lab results from 08/21/2019.  Pt is aware that the labs has not been resulted by the provider and when they have been resulted someone will give her call back.

## 2019-08-26 ENCOUNTER — Other Ambulatory Visit: Payer: Self-pay

## 2019-08-26 MED ORDER — AMLODIPINE BESYLATE 5 MG PO TABS: 5.0000 mg | ORAL_TABLET | Freq: Every day | ORAL | 0 refills | Status: DC

## 2019-08-26 MED ORDER — AMLODIPINE BESYLATE 2.5 MG PO TABS: 2.5000 mg | ORAL_TABLET | Freq: Every day | ORAL | 0 refills | Status: DC

## 2019-08-26 NOTE — Telephone Encounter (Signed)
Called Katherine [name] and gave her lab results. Katherine [name] wants to take the 7.5 mg of Amlodipine. I have sent in the new prescriptions for this. Katherine [name] has a follow up on 09/11/19 with Dr. Ardyth Harps. Katherine [name] verbalized an understanding.

## 2019-08-31 ENCOUNTER — Telehealth: Payer: Self-pay | Admitting: Internal Medicine

## 2019-08-31 NOTE — Telephone Encounter (Signed)
At this time would direct  Further   Management questions  To  Her PCP dr Ardyth Harps since  I saw her for swelling and  HT mostly.

## 2019-08-31 NOTE — Telephone Encounter (Signed)
Please see message. Next OV with Dr. Ardyth Harps is 09/11/2019

## 2019-08-31 NOTE — Telephone Encounter (Signed)
Patient states Dr. Fabian Sharp told her to take Ibuprofen or Aleve for her back pain.  Her pain has moved from her back to her hip.  Patient states she is taking 800 mg of Ibuprofen and it's not helping.  She said the only thing that has helped is she had one pain pill left from dental procedure so she took that.    Patient states it is ok to leave a message upon call back.

## 2019-09-01 NOTE — Telephone Encounter (Signed)
Will need OV to discuss further issues

## 2019-09-01 NOTE — Telephone Encounter (Signed)
Attempted to call the patient.  Unable to leave a message due to no voicemail.

## 2019-09-02 NOTE — Telephone Encounter (Signed)
2nd attempt to call patient but unable to leave a message. 

## 2019-09-03 NOTE — Telephone Encounter (Signed)
3rd attempt to call patient but unable to leave a message. 

## 2019-09-11 ENCOUNTER — Ambulatory Visit: Payer: Medicaid Other | Admitting: Internal Medicine

## 2019-09-29 ENCOUNTER — Ambulatory Visit: Payer: Medicaid Other | Admitting: Internal Medicine

## 2019-10-06 ENCOUNTER — Ambulatory Visit (INDEPENDENT_AMBULATORY_CARE_PROVIDER_SITE_OTHER): Payer: Self-pay | Admitting: Family Medicine

## 2019-10-06 ENCOUNTER — Other Ambulatory Visit: Payer: Self-pay

## 2019-10-06 ENCOUNTER — Encounter: Payer: Self-pay | Admitting: Family Medicine

## 2019-10-06 VITALS — BP 126/62 | HR 105 | Temp 98.2°F | Wt 183.6 lb

## 2019-10-06 DIAGNOSIS — M25472 Effusion, left ankle: Secondary | ICD-10-CM

## 2019-10-06 DIAGNOSIS — M25471 Effusion, right ankle: Secondary | ICD-10-CM

## 2019-10-06 DIAGNOSIS — M533 Sacrococcygeal disorders, not elsewhere classified: Secondary | ICD-10-CM

## 2019-10-06 DIAGNOSIS — I10 Essential (primary) hypertension: Secondary | ICD-10-CM

## 2019-10-06 MED ORDER — LOSARTAN POTASSIUM 50 MG PO TABS: 50.0000 mg | ORAL_TABLET | Freq: Every day | ORAL | 0 refills | Status: DC

## 2019-10-06 MED ORDER — NAPROXEN 500 MG PO TABS: 500.0000 mg | ORAL_TABLET | Freq: Two times a day (BID) | ORAL | 0 refills | Status: DC

## 2019-10-06 NOTE — Progress Notes (Signed)
Subjective:    Patient ID: Katherine [name], female    DOB: 1965/03/12, 54 y.o.   MRN: [id number]  HPI Here for several issues. First her feet and ankles swell every day and this upsets her. They are not painful. They go down somewhat overnight but swell up again the next day. No SOB. She was started on Amlodipine about a year ago for her BP, and this has worked well. She saw Dr. Regis Bill last month for this and they decreased her dose of Amlodipine from 10 mg daily to 7.5 mg, but this has not helped. She had a BMET, ESR, and Mg checked last month, and these were normal. Also she complains of intermittent left sided low back pain for several months. 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 ES Tylenol does not help.    Review of Systems  Constitutional: Negative.   Respiratory: Negative.   Cardiovascular: Positive for leg swelling. Negative for chest pain and palpitations.  Musculoskeletal: Positive for back pain.       Objective:   Physical Exam Constitutional:      Appearance: Normal appearance. She is not ill-appearing.  Cardiovascular:     Rate and Rhythm: Normal rate and regular rhythm.     Pulses: Normal pulses.     Heart sounds: Normal heart sounds.  Pulmonary:     Effort: Pulmonary effort is normal.     Breath sounds: Normal breath sounds.  Musculoskeletal:     Comments: 2+ edema in both ankles and feet. She is quite tender over the left sacroiliac joint. The spine has full ROM   Neurological:     Mental Status: She is alert.           Assessment & Plan:  The ankle edema is likely a side effect of the Amlodipine so we will stop this completely. Instead she will try Losartan 50 mg daily. She has sacroiliac pain so she will try Naproxen as needed. I asked her to follow up with Dr. Jerilee Hoh in 3-4 weeks.  Alysia Penna, MD

## 2019-10-12 ENCOUNTER — Telehealth: Payer: Self-pay | Admitting: Internal Medicine

## 2019-10-12 NOTE — Telephone Encounter (Signed)
Pt is calling in wanting to see if she can get some labs done for her TSH to see if the is what making her loss weight and appetite is off and on.  Pt state the you can leave a voice mail on her secured line.

## 2019-11-06 ENCOUNTER — Ambulatory Visit: Payer: Medicaid Other | Admitting: Internal Medicine

## 2020-01-29 ENCOUNTER — Telehealth (INDEPENDENT_AMBULATORY_CARE_PROVIDER_SITE_OTHER): Payer: BC Managed Care – PPO | Admitting: Internal Medicine

## 2020-01-29 ENCOUNTER — Other Ambulatory Visit: Payer: Self-pay

## 2020-01-29 ENCOUNTER — Encounter: Payer: Self-pay | Admitting: Internal Medicine

## 2020-01-29 VITALS — Wt 177.0 lb

## 2020-01-29 DIAGNOSIS — I10 Essential (primary) hypertension: Secondary | ICD-10-CM

## 2020-01-29 MED ORDER — LOSARTAN POTASSIUM 50 MG PO TABS: 50.0000 mg | ORAL_TABLET | Freq: Every day | ORAL | 1 refills | Status: DC

## 2020-01-29 NOTE — Progress Notes (Signed)
Virtual Visit via Telephone Note  I connected with Katherine [name] on 01/29/20 at  3:30 PM EST by telephone and verified that I am speaking with the correct person using two identifiers.   I discussed the limitations, risks, security and privacy concerns of performing an evaluation and management service by telephone and the availability of in person appointments. I also discussed with the patient that there may be a patient responsible charge related to this service. The patient expressed understanding and agreed to proceed.  Location patient: home Location provider: work office Participants present for the call: patient, provider Patient did not have a visit in the prior 7 days to address this/these issue(s).   History of Present Illness:  Katherine [name] has scheduled this visit because she needs refills of her losartan.  She was last seen by Dr. Clent Ridges in August for lower extremity edema.  At that time she was taken off amlodipine and placed on losartan 50 mg.  She has now run out.  She has not been checking her blood pressure, she has no idea what her blood pressure numbers are looking like.  She tells me that she has lost around 70 pounds.  She is requesting a podiatry referral for toenail clipping.   Observations/Objective: Patient sounds cheerful and well on the phone. I do not appreciate any increased work of breathing. Speech and thought processing are grossly intact. Patient reported vitals:    Current Outpatient Medications:  .  albuterol (PROVENTIL HFA;VENTOLIN HFA) 108 (90 BASE) MCG/ACT inhaler, Inhale 2 puffs into the lungs every 6 (six) hours as needed for wheezing., Disp: 1 Inhaler, Rfl: 2 .  alum & mag hydroxide-simeth (MYLANTA) 200-200-20 MG/5ML suspension, Take 15 mLs by mouth every 6 (six) hours as needed for indigestion or heartburn., Disp: 355 mL, Rfl: 1 .  aspirin EC 81 MG tablet, Take 1 tablet (81 mg total) by mouth daily., Disp: 30 tablet, Rfl: 0 .  chlorhexidine  (PERIDEX) 0.12 % solution, Use as directed 15 mLs in the mouth or throat 2 (two) times daily., Disp: 120 mL, Rfl: 0 .  DERMA-SMOOTHE/FS SCALP 0.01 % OIL, Apply      APPLY TO SCALP BID 2 WEEKS ON 1 WEEK OFF PRN ITCHING/FLARE, Disp: , Rfl:  .  diclofenac sodium (VOLTAREN) 1 % GEL, Apply 2 g topically 4 (four) times daily., Disp: 100 g, Rfl: 0 .  fluticasone (FLONASE) 50 MCG/ACT nasal spray, Place 1 spray into both nostrils daily., Disp: 9.9 g, Rfl: 2 .  ibuprofen (ADVIL,MOTRIN) 800 MG tablet, Take 1 tablet (800 mg total) by mouth 3 (three) times daily., Disp: 21 tablet, Rfl: 0 .  ketoconazole (NIZORAL) 2 % shampoo, Shampoo with 1 a small amount as directed   2 to 3 times a week, Disp: , Rfl:  .  naproxen (NAPROSYN) 500 MG tablet, Take 1 tablet (500 mg total) by mouth 2 (two) times daily., Disp: 60 tablet, Rfl: 0 .  omeprazole (PRILOSEC) 20 MG capsule, Take 1 capsule (20 mg total) by mouth daily., Disp: 30 capsule, Rfl: 1 .  losartan (COZAAR) 50 MG tablet, Take 1 tablet (50 mg total) by mouth daily., Disp: 90 tablet, Rfl: 1  Review of Systems:  Constitutional: Denies fever, chills, diaphoresis, appetite change and fatigue.  HEENT: Denies photophobia, eye pain, redness, hearing loss, ear pain, congestion, sore throat, rhinorrhea, sneezing, mouth sores, trouble swallowing, neck pain, neck stiffness and tinnitus.   Respiratory: Denies SOB, DOE, cough, chest tightness,  and wheezing.  Cardiovascular: Denies chest pain, palpitations and leg swelling.  Gastrointestinal: Denies nausea, vomiting, abdominal pain, diarrhea, constipation, blood in stool and abdominal distention.  Genitourinary: Denies dysuria, urgency, frequency, hematuria, flank pain and difficulty urinating.  Endocrine: Denies: hot or cold intolerance, sweats, changes in hair or nails, polyuria, polydipsia. Musculoskeletal: Denies myalgias, back pain, joint swelling, arthralgias and gait problem.  Skin: Denies pallor, rash and wound.   Neurological: Denies dizziness, seizures, syncope, weakness, light-headedness, numbness and headaches.  Hematological: Denies adenopathy. Easy bruising, personal or family bleeding history  Psychiatric/Behavioral: Denies suicidal ideation, mood changes, confusion, nervousness, sleep disturbance and agitation   Assessment and Plan:  Primary hypertension -I will go ahead and refill her losartan for 90 days, she has been instructed that further refills will require in person visit, she is in need of blood pressure checks and lab work.  I will go ahead and put in podiatry referral per request   I discussed the assessment and treatment plan with the patient. The patient was provided an opportunity to ask questions and all were answered. The patient agreed with the plan and demonstrated an understanding of the instructions.   The patient was advised to call back or seek an in-person evaluation if the symptoms worsen or if the condition fails to improve as anticipated.  I provided 13 minutes of non-face-to-face time during this encounter.   Chaya Jan, MD [facility]

## 2020-02-19 ENCOUNTER — Other Ambulatory Visit: Payer: Self-pay

## 2020-02-19 ENCOUNTER — Ambulatory Visit (INDEPENDENT_AMBULATORY_CARE_PROVIDER_SITE_OTHER): Payer: BC Managed Care – PPO | Admitting: Podiatry

## 2020-02-19 ENCOUNTER — Ambulatory Visit (INDEPENDENT_AMBULATORY_CARE_PROVIDER_SITE_OTHER): Payer: BC Managed Care – PPO

## 2020-02-19 ENCOUNTER — Encounter: Payer: Self-pay | Admitting: Podiatry

## 2020-02-19 DIAGNOSIS — M2011 Hallux valgus (acquired), right foot: Secondary | ICD-10-CM

## 2020-02-19 DIAGNOSIS — M2012 Hallux valgus (acquired), left foot: Secondary | ICD-10-CM | POA: Diagnosis not present

## 2020-02-19 DIAGNOSIS — Z01818 Encounter for other preprocedural examination: Secondary | ICD-10-CM

## 2020-02-19 DIAGNOSIS — M79671 Pain in right foot: Secondary | ICD-10-CM

## 2020-02-24 ENCOUNTER — Encounter: Payer: Self-pay | Admitting: Podiatry

## 2020-02-24 NOTE — Progress Notes (Signed)
Subjective:  Patient ID: Katherine [name], female    DOB: 1965-04-25,  MRN: [id number]  Chief Complaint  Patient presents with  . Bunions    Bilateral bunion pain.     55 y.o. female presents with the above complaint.  Patient presents with bilateral bunion deformity complaint that has been causing her a lot of pain especially when ambulating with shoes.  Patient states the left is greater than right side.  She states that she has tried all conservative treatment options including shoe gear modification and protecting padding it and has not gotten any relief.  She has not seen any other specialist for this.  At this time she would like to discuss surgical intervention to help correct the bunions that has been causing her a lot of pain.  She denies any other acute complaints  Review of Systems: Negative except as noted in the HPI. Denies N/V/F/Ch.  Past Medical History:  Diagnosis Date  . Asthma   . Asthma   . Bronchitis   . CHF (congestive heart failure) (HCC)   . Coronary artery disease    MI 2007  . Hypertension     Current Outpatient Medications:  .  albuterol (PROVENTIL HFA;VENTOLIN HFA) 108 (90 BASE) MCG/ACT inhaler, Inhale 2 puffs into the lungs every 6 (six) hours as needed for wheezing., Disp: 1 Inhaler, Rfl: 2 .  alum & mag hydroxide-simeth (MYLANTA) 200-200-20 MG/5ML suspension, Take 15 mLs by mouth every 6 (six) hours as needed for indigestion or heartburn., Disp: 355 mL, Rfl: 1 .  aspirin EC 81 MG tablet, Take 1 tablet (81 mg total) by mouth daily., Disp: 30 tablet, Rfl: 0 .  chlorhexidine (PERIDEX) 0.12 % solution, Use as directed 15 mLs in the mouth or throat 2 (two) times daily., Disp: 120 mL, Rfl: 0 .  DERMA-SMOOTHE/FS SCALP 0.01 % OIL, Apply      APPLY TO SCALP BID 2 WEEKS ON 1 WEEK OFF PRN ITCHING/FLARE, Disp: , Rfl:  .  diclofenac sodium (VOLTAREN) 1 % GEL, Apply 2 g topically 4 (four) times daily., Disp: 100 g, Rfl: 0 .  fluticasone (FLONASE) 50 MCG/ACT nasal  spray, Place 1 spray into both nostrils daily., Disp: 9.9 g, Rfl: 2 .  ibuprofen (ADVIL,MOTRIN) 800 MG tablet, Take 1 tablet (800 mg total) by mouth 3 (three) times daily., Disp: 21 tablet, Rfl: 0 .  ketoconazole (NIZORAL) 2 % shampoo, Shampoo with 1 a small amount as directed   2 to 3 times a week, Disp: , Rfl:  .  losartan (COZAAR) 50 MG tablet, Take 1 tablet (50 mg total) by mouth daily., Disp: 90 tablet, Rfl: 1 .  naproxen (NAPROSYN) 500 MG tablet, Take 1 tablet (500 mg total) by mouth 2 (two) times daily., Disp: 60 tablet, Rfl: 0 .  omeprazole (PRILOSEC) 20 MG capsule, Take 1 capsule (20 mg total) by mouth daily., Disp: 30 capsule, Rfl: 1  Social History   Tobacco Use  Smoking Status Former Smoker  . Types: Cigarettes  Smokeless Tobacco Never Used    No Known Allergies Objective:  There were no vitals filed for this visit. There is no height or weight on file to calculate BMI. Constitutional Well developed. Well nourished.  Vascular Dorsalis pedis pulses palpable bilaterally. Posterior tibial pulses palpable bilaterally. Capillary refill normal to all digits.  No cyanosis or clubbing noted. Pedal hair growth normal.  Neurologic Normal speech. Oriented to person, place, and time. Epicritic sensation to light touch grossly present bilaterally.  Dermatologic  Nails well groomed and normal in appearance. No open wounds. No skin lesions.  Orthopedic: Normal joint ROM without pain or crepitus bilaterally. Hallux abductovalgus deformity present bilaterally left greater than right.  They are both tracking deformity.  No intra-articular of the first metatarsal phalangeal joint pain noted Left 1st MPJ diminished range of motion. Left 1st TMT without gross hypermobility. Right 1st MPJ diminished range of motion  Right 1st TMT without gross hypermobility. Lesser digital contractures absent bilaterally.   Radiographs: Taken and reviewed. Hallux abductovalgus deformity present. 3 views  of skeletally mature adult bilateral foot: Moderate bunion deformity noted bilaterally with left greater than right slightly there is increasing intermetatarsal angle increasing hallux valgus angle.  Sesamoid position is 5 out of 7 there is an increasing medial hypertrophy eminence.  Mild hammertoe contractures noted no other bony abnormalities identified.Metatarsal parabola within normal limits  Assessment:   1. Hallux abducto valgus, left   2. Hallux abducto valgus, right   3. Preoperative examination    Plan:  Patient was evaluated and treated and all questions answered.  Hallux abductovalgus deformity, left greater than right -XR as above. -I discussed my radiographic as well as clinical findings with the patient in extensive detail.  Given that she has more pain on the left side than right side for now we will focus on intervention on the left after left is completely healed we will discuss the right bunion correction.  Patient agrees with the plan would like to proceed with the surgery.  Given that she has failed all conservative treatment options I believe she is an ideal candidate for surgical intervention.  I discussed with the patient in extensive detail my surgical plan which include chevron osteotomy with a possible phalangeal osteotomy to help with the correction.  I also discussed with the patient that she will also benefit with orthotics after the correction and we will discuss getting casting for orthotics once the bunions have been corrected.  Patient agrees with the plan. -I discussed my postop protocol in extensive detail which includes weightbearing as tolerated cam boot.  Patient agrees with the plan would like to proceed with that. -Cam boot was dispensed -Informed surgical risk consent was reviewed and read aloud to the patient.  I reviewed the films.  I have discussed my findings with the patient in great detail.  I have discussed all risks including but not limited to  infection, stiffness, scarring, limp, disability, deformity, damage to blood vessels and nerves, numbness, poor healing, need for braces, arthritis, chronic pain, amputation, death.  All benefits and realistic expectations discussed in great detail.  I have made no promises as to the outcome.  I have provided realistic expectations.  I have offered the patient a 2nd opinion, which they have declined and assured me they preferred to proceed despite the risks -A total of 47 minutes was spent in direct patient care as well as pre and post patient encounter activities.  This includes documentation as well as reviewing patient chart for labs, imaging, past medical, surgical, social, and family history as documented in the EMR.  I have reviewed medication allergies as documented in EMR.  I discussed the etiology of condition and treatment options from conservative to surgical care.  All risks and benefit of the treatment course was discussed in detail.  All questions were answered and return appointment was discussed.  Since the visit completed in an ambulatory/outpatient setting, the patient and/or parent/guardian has been advised to contact the providers office  for worsening condition and seek medical treatment and/or call 911 if the patient deems either is necessary.   No follow-ups on file.

## 2020-03-18 ENCOUNTER — Telehealth: Payer: Self-pay

## 2020-03-18 NOTE — Telephone Encounter (Signed)
DOS 03/28/2020  [facility] OSTEOTOM[facility] - [id number] [facility] BUNIONECT[facility] - [id number]  [employer] EFFECTIVE DATE - 12/27/2019  PLAN DEDUCTIBLE - $200.00 W/ $200.00 MET OUT OF POCKET - $500.00 W/ $50.21 MET   SPOKE TO DIAMOND AT [employer], SHE STATED NO PRECERT REQUIRED FOR OUTPATIENT SURGERY. CALL REF# [id number]

## 2020-03-29 ENCOUNTER — Encounter: Payer: Self-pay | Admitting: Podiatry

## 2020-03-30 ENCOUNTER — Encounter: Payer: BC Managed Care – PPO | Admitting: Podiatry

## 2020-04-01 ENCOUNTER — Encounter: Payer: BC Managed Care – PPO | Admitting: Podiatry

## 2020-04-15 ENCOUNTER — Encounter: Payer: BC Managed Care – PPO | Admitting: Podiatry

## 2020-04-21 ENCOUNTER — Telehealth: Payer: Self-pay

## 2020-04-21 ENCOUNTER — Encounter: Payer: Self-pay | Admitting: Podiatry

## 2020-04-21 NOTE — Telephone Encounter (Signed)
Kylia called to reschedule her surgery with Dr. Allena Katz on 05/02/2020. Her new surgery date is 07/18/2020. Notified Dr. Allena Katz and Aram Beecham with [facility].

## 2020-05-11 ENCOUNTER — Encounter: Payer: BC Managed Care – PPO | Admitting: Podiatry

## 2020-05-25 ENCOUNTER — Encounter: Payer: BC Managed Care – PPO | Admitting: Podiatry

## 2020-06-27 ENCOUNTER — Telehealth: Payer: Self-pay | Admitting: Urology

## 2020-06-27 NOTE — Telephone Encounter (Signed)
DOS - 07/18/20  [facility] BUNIONECTOMY LEFT --- [id number] [facility] OSTEOTOMY LEFT --- [id number]  [employer] EFFECTIVE DATE - 12/27/19   PLAN DEDUCTIBLE - $200.00 OUT OF POCKET - $500.00 COINSURANCE - 20% COPAY - $0.00  SPOKE WITH SCOTT WITH [employer] AND HE STATED THAT CPT CODES 23343 AND  28299 NO PRIOR AUTH IS REQUIRED.   REF # [id number]

## 2020-07-14 ENCOUNTER — Other Ambulatory Visit: Payer: Self-pay

## 2020-07-14 ENCOUNTER — Emergency Department (HOSPITAL_COMMUNITY)
Admission: EM | Admit: 2020-07-14 | Discharge: 2020-07-14 | Disposition: A | Discharge status: Home / Self Care | Payer: BC Managed Care – PPO | Attending: Emergency Medicine | Admitting: Emergency Medicine

## 2020-07-14 ENCOUNTER — Encounter (HOSPITAL_COMMUNITY): Payer: Self-pay | Admitting: Emergency Medicine

## 2020-07-14 DIAGNOSIS — I10 Essential (primary) hypertension: Secondary | ICD-10-CM

## 2020-07-14 DIAGNOSIS — J45909 Unspecified asthma, uncomplicated: Secondary | ICD-10-CM | POA: Diagnosis not present

## 2020-07-14 DIAGNOSIS — I110 Hypertensive heart disease with heart failure: Secondary | ICD-10-CM | POA: Insufficient documentation

## 2020-07-14 DIAGNOSIS — Z7951 Long term (current) use of inhaled steroids: Secondary | ICD-10-CM | POA: Insufficient documentation

## 2020-07-14 DIAGNOSIS — I2510 Atherosclerotic heart disease of native coronary artery without angina pectoris: Secondary | ICD-10-CM | POA: Diagnosis not present

## 2020-07-14 DIAGNOSIS — R519 Headache, unspecified: Secondary | ICD-10-CM | POA: Diagnosis present

## 2020-07-14 DIAGNOSIS — Z87891 Personal history of nicotine dependence: Secondary | ICD-10-CM | POA: Diagnosis not present

## 2020-07-14 DIAGNOSIS — Z7982 Long term (current) use of aspirin: Secondary | ICD-10-CM | POA: Insufficient documentation

## 2020-07-14 DIAGNOSIS — Z79899 Other long term (current) drug therapy: Secondary | ICD-10-CM | POA: Diagnosis not present

## 2020-07-14 DIAGNOSIS — I509 Heart failure, unspecified: Secondary | ICD-10-CM | POA: Insufficient documentation

## 2020-07-14 LAB — CBC WITH DIFFERENTIAL/PLATELET
Abs Immature Granulocytes: 0.01 10*3/uL (ref 0.00–0.07)
Basophils Absolute: 0 10*3/uL (ref 0.0–0.1)
Basophils Relative: 1 %
Eosinophils Absolute: 0.2 10*3/uL (ref 0.0–0.5)
Eosinophils Relative: 4 %
HCT: 38.9 % (ref 36.0–46.0)
Hemoglobin: 12.5 g/dL (ref 12.0–15.0)
Immature Granulocytes: 0 %
Lymphocytes Relative: 43 %
Lymphs Abs: 1.8 10*3/uL (ref 0.7–4.0)
MCH: 30.6 pg (ref 26.0–34.0)
MCHC: 32.1 g/dL (ref 30.0–36.0)
MCV: 95.1 fL (ref 80.0–100.0)
Monocytes Absolute: 0.4 10*3/uL (ref 0.1–1.0)
Monocytes Relative: 8 %
Neutro Abs: 1.9 10*3/uL (ref 1.7–7.7)
Neutrophils Relative %: 44 %
Platelets: 273 10*3/uL (ref 150–400)
RBC: 4.09 MIL/uL (ref 3.87–5.11)
RDW: 13.6 % (ref 11.5–15.5)
WBC: 4.3 10*3/uL (ref 4.0–10.5)
nRBC: 0 % (ref 0.0–0.2)

## 2020-07-14 LAB — BASIC METABOLIC PANEL
Anion gap: 11 (ref 5–15)
BUN: 13 mg/dL (ref 6–20)
CO2: 24 mmol/L (ref 22–32)
Calcium: 8.7 mg/dL — ABNORMAL LOW (ref 8.9–10.3)
Chloride: 103 mmol/L (ref 98–111)
Creatinine, Ser: 0.63 mg/dL (ref 0.44–1.00)
GFR, Estimated: >60 mL/min (ref >60)
Glucose, Bld: 71 mg/dL (ref 70–99)
Potassium: 3.7 mmol/L (ref 3.5–5.1)
Sodium: 138 mmol/L (ref 135–145)

## 2020-07-14 MED ORDER — LOSARTAN POTASSIUM 50 MG PO TABS: 50.0000 mg | ORAL_TABLET | Freq: Every day | ORAL | 1 refills | Status: AC

## 2020-07-14 MED ORDER — KETOROLAC TROMETHAMINE 15 MG/ML IJ SOLN
15.0000 mg | Freq: Once | INTRAMUSCULAR | Status: AC
  Administered 2020-07-14: 15 mg via INTRAVENOUS
  Filled 2020-07-14: qty 1

## 2020-07-14 MED ORDER — METOCLOPRAMIDE HCL 5 MG/ML IJ SOLN
10.0000 mg | Freq: Once | INTRAMUSCULAR | Status: AC
  Administered 2020-07-14: 10 mg via INTRAVENOUS
  Filled 2020-07-14: qty 2

## 2020-07-14 MED ORDER — DEXAMETHASONE SODIUM PHOSPHATE 10 MG/ML IJ SOLN
10.0000 mg | Freq: Once | INTRAMUSCULAR | Status: AC
  Administered 2020-07-14: 10 mg via INTRAVENOUS
  Filled 2020-07-14: qty 1

## 2020-07-14 MED ORDER — SODIUM CHLORIDE 0.9 % IV BOLUS
500.0000 mL | Freq: Once | INTRAVENOUS | Status: AC
  Administered 2020-07-14: 500 mL via INTRAVENOUS

## 2020-07-14 MED ORDER — LOSARTAN POTASSIUM 50 MG PO TABS
50.0000 mg | ORAL_TABLET | Freq: Once | ORAL | Status: AC
  Administered 2020-07-14: 50 mg via ORAL
  Filled 2020-07-14: qty 1
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
## 2020-07-14 NOTE — Discharge Instructions (Signed)
Follow-up with your primary care provider on this matter.  Call to make an appointment.

## 2020-07-14 NOTE — ED Provider Notes (Signed)
Emergency Medicine Provider Triage Evaluation Note  Katherine [name] , a 55 y.o. female  was evaluated in triage.  Pt complains of that began this morning upon waking up.  States that she checked her blood pressure and was in the 160 systolic which is high for her.  No history of hypertension and is not on any antihypertensives.  Has not tried any medication to help with her headache.  Denies any vision changes, numbness in arms or legs, history of stroke or hemorrhage.  Review of Systems  Positive: Headache Negative: Vision changes, none  Physical Exam  LMP 11/05/2012  Gen:   Awake, no distress   Resp:  Normal effort  MSK:   Moves extremities without difficulty  Other:  No facial asymmetry noted  Medical Decision Making  Medically screening exam initiated at 12:57 PM.  Appropriate orders placed.  Katherine [name] was informed that the remainder of the evaluation will be completed by another provider, this initial triage assessment does not replace that evaluation, and the importance of remaining in the ED until their evaluation is complete.  Will require basic lab work   Katherine [name], Katherine [name] 07/14/20 1258    Katherine Grizzle, MD 07/18/20 1340

## 2020-07-14 NOTE — ED Triage Notes (Signed)
Patient here with complaint of headache and hypertension, reports BP 169/105 at home. BP 153/102 in triage. Patient denies history of hypertension. Patient alert, oriented, and in no apparent distress at this time. Patient has no tried taking medication to treat headache.

## 2020-07-15 ENCOUNTER — Emergency Department (HOSPITAL_COMMUNITY)
Admission: EM | Admit: 2020-07-15 | Discharge: 2020-07-15 | Disposition: A | Discharge status: Home / Self Care | Payer: BC Managed Care – PPO | Attending: Emergency Medicine | Admitting: Emergency Medicine

## 2020-07-15 ENCOUNTER — Telehealth: Payer: Self-pay | Admitting: Internal Medicine

## 2020-07-15 ENCOUNTER — Encounter (HOSPITAL_COMMUNITY): Payer: Self-pay | Admitting: Pharmacy Technician

## 2020-07-15 ENCOUNTER — Other Ambulatory Visit: Payer: Self-pay

## 2020-07-15 DIAGNOSIS — Z79899 Other long term (current) drug therapy: Secondary | ICD-10-CM | POA: Insufficient documentation

## 2020-07-15 DIAGNOSIS — Z5321 Procedure and treatment not carried out due to patient leaving prior to being seen by health care provider: Secondary | ICD-10-CM | POA: Diagnosis not present

## 2020-07-15 DIAGNOSIS — R030 Elevated blood-pressure reading, without diagnosis of hypertension: Secondary | ICD-10-CM | POA: Diagnosis present

## 2020-07-15 DIAGNOSIS — I10 Essential (primary) hypertension: Secondary | ICD-10-CM | POA: Insufficient documentation

## 2020-07-15 NOTE — ED Provider Notes (Signed)
Emergency Medicine Provider Triage Evaluation Note  Katherine [name] , a 55 y.o. female  was evaluated in triage.  Pt complains of changes to antihypertensive.  States that she was here yesterday and prescribed losartan and she took 1 dose last night but she continues to be hypertensive.  States that this is not the medication that she was on previously as she told her PCP yesterday that we prescribed this medication.  States that her PCP cannot represcribe the medication because she needs to have an office visit with her first.  She is requesting [place] to add on a diuretic to her antihypertensive.  States that her blood pressure elevates when she gets upset and she does not want that to happen.  Review of Systems  Positive: Hypertension Negative: Chest pain  Physical Exam  BP (!) 175/103 (BP Location: Left Arm)   Pulse (!) 106   Resp 14   LMP 11/05/2012   SpO2 100%  Gen:   Awake, no distress Resp:  Normal effort MSK:   Moves extremities without difficulty Other:  No acute distress  Medical Decision Making  Medically screening exam initiated at 1:04 PM.  Appropriate orders placed.  Katherine [name] was informed that the remainder of the evaluation will be completed by another provider, this initial triage assessment does not replace that evaluation, and the importance of remaining in the ED until their evaluation is complete.  I tried to have a discussion with the patient stating that blood pressure medications will take time to see improvement but she states that she wants to be evaluated again   [name], PA-C 07/15/20 1305    [name], MD 07/18/20 1340

## 2020-07-15 NOTE — ED Triage Notes (Signed)
Pt here with reports of hypertension. Pt in NAD, no neuro deficits noted. Seen yesterday for the same.

## 2020-07-15 NOTE — Telephone Encounter (Signed)
PT is in the hospital and stated that Dr. Michel Santee put her on a bp medication amLODipine (NORVASC) 10 MG tablet [[id number]] . DISCONTINUED the hospital prescribed another medication for her BP is not working per pt want to know if she can go back on the amLODipine. pt would like a call to advise

## 2020-07-15 NOTE — Telephone Encounter (Signed)
Patient has scheduled an appointment with      Dr Salomon Fick 07/18/20

## 2020-07-15 NOTE — Telephone Encounter (Signed)
Left message on machine for patient to schedule an appointment

## 2020-07-15 NOTE — ED Notes (Signed)
Pt requested recheck BP 169/103

## 2020-07-15 NOTE — Discharge Instructions (Addendum)
Follow-up with Dr. Ardyth Harps for the blood pressure.  You are on the same medicine at the same dose that she had prescribed in December.  Start a blood pressure log but try not to worry about the numbers.

## 2020-07-15 NOTE — ED Notes (Signed)
Vital signs stable. 

## 2020-07-18 ENCOUNTER — Other Ambulatory Visit: Payer: Self-pay

## 2020-07-18 ENCOUNTER — Ambulatory Visit (INDEPENDENT_AMBULATORY_CARE_PROVIDER_SITE_OTHER): Payer: BC Managed Care – PPO | Admitting: Family Medicine

## 2020-07-18 ENCOUNTER — Encounter: Payer: Self-pay | Admitting: Family Medicine

## 2020-07-18 VITALS — BP 140/98 | HR 111 | Temp 98.4°F | Wt 173.4 lb

## 2020-07-18 DIAGNOSIS — H409 Unspecified glaucoma: Secondary | ICD-10-CM | POA: Diagnosis not present

## 2020-07-18 DIAGNOSIS — R000 Tachycardia, unspecified: Secondary | ICD-10-CM | POA: Diagnosis not present

## 2020-07-18 DIAGNOSIS — F40243 Fear of flying: Secondary | ICD-10-CM | POA: Diagnosis not present

## 2020-07-18 DIAGNOSIS — I10 Essential (primary) hypertension: Secondary | ICD-10-CM | POA: Diagnosis not present

## 2020-07-18 NOTE — Patient Instructions (Signed)
Managing Your Hypertension Hypertension, also called high blood pressure, is when the force of the blood pressing against the walls of the arteries is too strong. Arteries are blood vessels that carry blood from your heart throughout your body. Hypertension forces the heart to work harder to pump blood and may cause the arteries to become narrow or stiff. Understanding blood pressure readings Your personal target blood pressure may vary depending on your medical conditions, your age, and other factors. A blood pressure reading includes a higher number over a lower number. Ideally, your blood pressure should be below 120/80. You should know that:  The first, or top, number is called the systolic pressure. It is a measure of the pressure in your arteries as your heart beats.  The second, or bottom number, is called the diastolic pressure. It is a measure of the pressure in your arteries as the heart relaxes. Blood pressure is classified into four stages. Based on your blood pressure reading, your health care provider may use the following stages to determine what type of treatment you need, if any. Systolic pressure and diastolic pressure are measured in a unit called mmHg. Normal  Systolic pressure: below 120.  Diastolic pressure: below 80. Elevated  Systolic pressure: 120-129.  Diastolic pressure: below 80. Hypertension stage 1  Systolic pressure: 130-139.  Diastolic pressure: 80-89. Hypertension stage 2  Systolic pressure: 140 or above.  Diastolic pressure: 90 or above. How can this condition affect me? Managing your hypertension is an important responsibility. Over time, hypertension can damage the arteries and decrease blood flow to important parts of the body, including the brain, heart, and kidneys. Having untreated or uncontrolled hypertension can lead to:  A heart attack.  A stroke.  A weakened blood vessel (aneurysm).  Heart failure.  Kidney damage.  Eye  damage.  Metabolic syndrome.  Memory and concentration problems.  Vascular dementia. What actions can I take to manage this condition? Hypertension can be managed by making lifestyle changes and possibly by taking medicines. Your health care provider will help you make a plan to bring your blood pressure within a normal range. Nutrition  Eat a diet that is high in fiber and potassium, and low in salt (sodium), added sugar, and fat. An example eating plan is called the Dietary Approaches to Stop Hypertension (DASH) diet. To eat this way: ? Eat plenty of fresh fruits and vegetables. Try to fill one-half of your plate at each meal with fruits and vegetables. ? Eat whole grains, such as whole-wheat pasta, brown rice, or whole-grain bread. Fill about one-fourth of your plate with whole grains. ? Eat low-fat dairy products. ? Avoid fatty cuts of meat, processed or cured meats, and poultry with skin. Fill about one-fourth of your plate with lean proteins such as fish, chicken without skin, beans, eggs, and tofu. ? Avoid pre-made and processed foods. These tend to be higher in sodium, added sugar, and fat.  Reduce your daily sodium intake. Most people with hypertension should eat less than 1,500 mg of sodium a day.   Lifestyle  Work with your health care provider to maintain a healthy body weight or to lose weight. Ask what an ideal weight is for you.  Get at least 30 minutes of exercise that causes your heart to beat faster (aerobic exercise) most days of the week. Activities may include walking, swimming, or biking.  Include exercise to strengthen your muscles (resistance exercise), such as weight lifting, as part of your weekly exercise routine. Try   to do these types of exercises for 30 minutes at least 3 days a week.  Do not use any products that contain nicotine or tobacco, such as cigarettes, e-cigarettes, and chewing tobacco. If you need help quitting, ask your health care  provider.  Control any long-term (chronic) conditions you have, such as high cholesterol or diabetes.  Identify your sources of stress and find ways to manage stress. This may include meditation, deep breathing, or making time for fun activities.   Alcohol use  Do not drink alcohol if: ? Your health care provider tells you not to drink. ? You are pregnant, may be pregnant, or are planning to become pregnant.  If you drink alcohol: ? Limit how much you use to:  0-1 drink a day for women.  0-2 drinks a day for men. ? Be aware of how much alcohol is in your drink. In the [place] one drink equals one 12 oz bottle of beer (355 mL), one 5 oz glass of wine (148 mL), or one 1 oz glass of hard liquor (44 mL). Medicines Your health care provider may prescribe medicine if lifestyle changes are not enough to get your blood pressure under control and if:  Your systolic blood pressure is 130 or higher.  Your diastolic blood pressure is 80 or higher. Take medicines only as told by your health care provider. Follow the directions carefully. Blood pressure medicines must be taken as told by your health care provider. The medicine does not work as well when you skip doses. Skipping doses also puts you at risk for problems. Monitoring Before you monitor your blood pressure:  Do not smoke, drink caffeinated beverages, or exercise within 30 minutes before taking a measurement.  Use the bathroom and empty your bladder (urinate).  Sit quietly for at least 5 minutes before taking measurements. Monitor your blood pressure at home as told by your health care provider. To do this:  Sit with your back straight and supported.  Place your feet flat on the floor. Do not cross your legs.  Support your arm on a flat surface, such as a table. Make sure your upper arm is at heart level.  Each time you measure, take two or three readings one minute apart and record the results. You may also need to have your  blood pressure checked regularly by your health care provider.   General information  Talk with your health care provider about your diet, exercise habits, and other lifestyle factors that may be contributing to hypertension.  Review all the medicines you take with your health care provider because there may be side effects or interactions.  Keep all visits as told by your health care provider. Your health care provider can help you create and adjust your plan for managing your high blood pressure. Where to find more information  National Heart, Lung, and Blood Institute: www.nhlbi.nih.gov  American Heart Association: www.heart.org Contact a health care provider if:  You think you are having a reaction to medicines you have taken.  You have repeated (recurrent) headaches.  You feel dizzy.  You have swelling in your ankles.  You have trouble with your vision. Get help right away if:  You develop a severe headache or confusion.  You have unusual weakness or numbness, or you feel faint.  You have severe pain in your chest or abdomen.  You vomit repeatedly.  You have trouble breathing. These symptoms may represent a serious problem that is an emergency. Do not wait   to see if the symptoms will go away. Get medical help right away. Call your local emergency services (911 in the [place] Do not drive yourself to the hospital. Summary  Hypertension is when the force of blood pumping through your arteries is too strong. If this condition is not controlled, it may put you at risk for serious complications.  Your personal target blood pressure may vary depending on your medical conditions, your age, and other factors. For most people, a normal blood pressure is less than 120/80.  Hypertension is managed by lifestyle changes, medicines, or both.  Lifestyle changes to help manage hypertension include losing weight, eating a healthy, low-sodium diet, exercising more, stopping smoking, and  limiting alcohol. This information is not intended to replace advice given to you by your health care provider. Make sure you discuss any questions you have with your health care provider. Document Revised: 03/06/2019 Document Reviewed: 12/30/2018 Elsevier Patient Education  2021 [place].  How to Take Your Blood Pressure Blood pressure measures how strongly your blood is pressing against the walls of your arteries. Arteries are blood vessels that carry blood from your heart throughout your body. You can take your blood pressure at home with a machine. You may need to check your blood pressure at home:  To check if you have high blood pressure (hypertension).  To check your blood pressure over time.  To make sure your blood pressure medicine is working. Supplies needed:  Blood pressure machine, or monitor.  Dining room chair to sit in.  Table or desk.  Small notebook.  Pencil or pen. How to prepare Avoid these things for 30 minutes before checking your blood pressure:  Having drinks with caffeine in them, such as coffee or tea.  Drinking alcohol.  Eating.  Smoking.  Exercising. Do these things five minutes before checking your blood pressure:  Go to the bathroom and pee (urinate).  Sit in a dining chair. Do not sit in a soft couch or an armchair.  Be quiet. Do not talk. How to take your blood pressure Follow the instructions that came with your machine. If you have a digital blood pressure monitor, these may be the instructions: 1. Sit up straight. 2. Place your feet on the floor. Do not cross your ankles or legs. 3. Rest your left arm at the level of your heart. You may rest it on a table, desk, or chair. 4. Pull up your shirt sleeve. 5. Wrap the blood pressure cuff around the upper part of your left arm. The cuff should be 1 inch (2.5 cm) above your elbow. It is best to wrap the cuff around bare skin. 6. Fit the cuff snugly around your arm. You should be able  to place only one finger between the cuff and your arm. 7. Place the cord so that it rests in the bend of your elbow. 8. Press the power button. 9. Sit quietly while the cuff fills with air and loses air. 10. Write down the numbers on the screen. 11. Wait 2-3 minutes and then repeat steps 1-10.   What do the numbers mean? Two numbers make up your blood pressure. The first number is called systolic pressure. The second is called diastolic pressure. An example of a blood pressure reading is "120 over 80" (or 120/80). If you are an adult and do not have a medical condition, use this guide to find out if your blood pressure is normal: Normal  First number: below 120.  Second  number: below 80. Elevated  First number: 120-129.  Second number: below 80. Hypertension stage 1  First number: 130-139.  Second number: 80-89. Hypertension stage 2  First number: 140 or above.  Second number: 90 or above. Your blood pressure is above normal even if only the top or bottom number is above normal. Follow these instructions at home:  Check your blood pressure as often as your doctor tells you to.  Check your blood pressure at the same time every day.  Take your monitor to your next doctor's appointment. Your doctor will: ? Make sure you are using it correctly. ? Make sure it is working right.  Make sure you understand what your blood pressure numbers should be.  Tell your doctor if your medicine is causing side effects.  Keep all follow-up visits as told by your doctor. This is important. General tips:  You will need a blood pressure machine, or monitor. Your doctor can suggest a monitor. You can buy one at a drugstore or online. When choosing one: ? Choose one with an arm cuff. ? Choose one that wraps around your upper arm. Only one finger should fit between your arm and the cuff. ? Do not choose one that measures your blood pressure from your wrist or finger. Where to find more  information American Heart Association: www.heart.org Contact a doctor if:  Your blood pressure keeps being high. Get help right away if:  Your first blood pressure number is higher than 180.  Your second blood pressure number is higher than 120. Summary  Check your blood pressure at the same time every day.  Avoid caffeine, alcohol, smoking, and exercise for 30 minutes before checking your blood pressure.  Make sure you understand what your blood pressure numbers should be. This information is not intended to replace advice given to you by your health care provider. Make sure you discuss any questions you have with your health care provider. Document Revised: 01/23/2019 Document Reviewed: 01/23/2019 Elsevier Patient Education  2021 Elsevier Inc.  https://www.mata.com/https://www.nhlbi.nih.gov/files/docs/public/heart/dash_brief.pdf">  DASH Eating Plan DASH stands for Dietary Approaches to Stop Hypertension. The DASH eating plan is a healthy eating plan that has been shown to:  Reduce high blood pressure (hypertension).  Reduce your risk for type 2 diabetes, heart disease, and stroke.  Help with weight loss. What are tips for following this plan? Reading food labels  Check food labels for the amount of salt (sodium) per serving. Choose foods with less than 5 percent of the Daily Value of sodium. Generally, foods with less than 300 milligrams (mg) of sodium per serving fit into this eating plan.  To find whole grains, look for the word "whole" as the first word in the ingredient list. Shopping  Buy products labeled as "low-sodium" or "no salt added."  Buy fresh foods. Avoid canned foods and pre-made or frozen meals. Cooking  Avoid adding salt when cooking. Use salt-free seasonings or herbs instead of table salt or sea salt. Check with your health care provider or pharmacist before using salt substitutes.  Do not fry foods. Cook foods using healthy methods such as baking, boiling, grilling,  roasting, and broiling instead.  Cook with heart-healthy oils, such as olive, canola, avocado, soybean, or sunflower oil. Meal planning  Eat a balanced diet that includes: ? 4 or more servings of fruits and 4 or more servings of vegetables each day. Try to fill one-half of your plate with fruits and vegetables. ? 6-8 servings of whole grains each day. ?  Less than 6 oz (170 g) of lean meat, poultry, or fish each day. A 3-oz (85-g) serving of meat is about the same size as a deck of cards. One egg equals 1 oz (28 g). ? 2-3 servings of low-fat dairy each day. One serving is 1 cup (237 mL). ? 1 serving of nuts, seeds, or beans 5 times each week. ? 2-3 servings of heart-healthy fats. Healthy fats called omega-3 fatty acids are found in foods such as walnuts, flaxseeds, fortified milks, and eggs. These fats are also found in cold-water fish, such as sardines, salmon, and mackerel.  Limit how much you eat of: ? Canned or prepackaged foods. ? Food that is high in trans fat, such as some fried foods. ? Food that is high in saturated fat, such as fatty meat. ? Desserts and other sweets, sugary drinks, and other foods with added sugar. ? Full-fat dairy products.  Do not salt foods before eating.  Do not eat more than 4 egg yolks a week.  Try to eat at least 2 vegetarian meals a week.  Eat more home-cooked food and less restaurant, buffet, and fast food.   Lifestyle  When eating at a restaurant, ask that your food be prepared with less salt or no salt, if possible.  If you drink alcohol: ? Limit how much you use to:  0-1 drink a day for women who are not pregnant.  0-2 drinks a day for men. ? Be aware of how much alcohol is in your drink. In the [place] one drink equals one 12 oz bottle of beer (355 mL), one 5 oz glass of wine (148 mL), or one 1 oz glass of hard liquor (44 mL). General information  Avoid eating more than 2,300 mg of salt a day. If you have hypertension, you may need to  reduce your sodium intake to 1,500 mg a day.  Work with your health care provider to maintain a healthy body weight or to lose weight. Ask what an ideal weight is for you.  Get at least 30 minutes of exercise that causes your heart to beat faster (aerobic exercise) most days of the week. Activities may include walking, swimming, or biking.  Work with your health care provider or dietitian to adjust your eating plan to your individual calorie needs. What foods should I eat? Fruits All fresh, dried, or frozen fruit. Canned fruit in natural juice (without added sugar). Vegetables Fresh or frozen vegetables (raw, steamed, roasted, or grilled). Low-sodium or reduced-sodium tomato and vegetable juice. Low-sodium or reduced-sodium tomato sauce and tomato paste. Low-sodium or reduced-sodium canned vegetables. Grains Whole-grain or whole-wheat bread. Whole-grain or whole-wheat pasta. Brown rice. Orpah Cobb. Bulgur. Whole-grain and low-sodium cereals. Pita bread. Low-fat, low-sodium crackers. Whole-wheat flour tortillas. Meats and other proteins Skinless chicken or [place]. Ground chicken or [place]. Pork with fat trimmed off. Fish and seafood. Egg whites. Dried beans, peas, or lentils. Unsalted nuts, nut butters, and seeds. Unsalted canned beans. Lean cuts of beef with fat trimmed off. Low-sodium, lean precooked or cured meat, such as sausages or meat loaves. Dairy Low-fat (1%) or fat-free (skim) milk. Reduced-fat, low-fat, or fat-free cheeses. Nonfat, low-sodium ricotta or cottage cheese. Low-fat or nonfat yogurt. Low-fat, low-sodium cheese. Fats and oils Soft margarine without trans fats. Vegetable oil. Reduced-fat, low-fat, or light mayonnaise and salad dressings (reduced-sodium). Canola, safflower, olive, avocado, soybean, and sunflower oils. Avocado. Seasonings and condiments Herbs. Spices. Seasoning mixes without salt. Other foods Unsalted popcorn and pretzels. Fat-free sweets. The  items  listed above may not be a complete list of foods and beverages you can eat. Contact a dietitian for more information. What foods should I avoid? Fruits Canned fruit in a light or heavy syrup. Fried fruit. Fruit in cream or butter sauce. Vegetables Creamed or fried vegetables. Vegetables in a cheese sauce. Regular canned vegetables (not low-sodium or reduced-sodium). Regular canned tomato sauce and paste (not low-sodium or reduced-sodium). Regular tomato and vegetable juice (not low-sodium or reduced-sodium). Rosita Fire. Olives. Grains Baked goods made with fat, such as croissants, muffins, or some breads. Dry pasta or rice meal packs. Meats and other proteins Fatty cuts of meat. Ribs. Fried meat. Tomasa Blase. Bologna, salami, and other precooked or cured meats, such as sausages or meat loaves. Fat from the back of a pig (fatback). Bratwurst. Salted nuts and seeds. Canned beans with added salt. Canned or smoked fish. Whole eggs or egg yolks. Chicken or [place] with skin. Dairy Whole or 2% milk, cream, and half-and-half. Whole or full-fat cream cheese. Whole-fat or sweetened yogurt. Full-fat cheese. Nondairy creamers. Whipped toppings. Processed cheese and cheese spreads. Fats and oils Butter. Stick margarine. Lard. Shortening. Ghee. Bacon fat. Tropical oils, such as coconut, palm kernel, or palm oil. Seasonings and condiments Onion salt, garlic salt, seasoned salt, table salt, and sea salt. Worcestershire sauce. Tartar sauce. Barbecue sauce. Teriyaki sauce. Soy sauce, including reduced-sodium. Steak sauce. Canned and packaged gravies. Fish sauce. Oyster sauce. Cocktail sauce. Store-bought horseradish. Ketchup. Mustard. Meat flavorings and tenderizers. Bouillon cubes. Hot sauces. Pre-made or packaged marinades. Pre-made or packaged taco seasonings. Relishes. Regular salad dressings. Other foods Salted popcorn and pretzels. The items listed above may not be a complete list of foods and beverages you should  avoid. Contact a dietitian for more information. Where to find more information  National Heart, Lung, and Blood Institute: PopSteam.is  American Heart Association: www.heart.org  Academy of Nutrition and Dietetics: www.eatright.org  National Kidney Foundation: www.kidney.org Summary  The DASH eating plan is a healthy eating plan that has been shown to reduce high blood pressure (hypertension). It may also reduce your risk for type 2 diabetes, heart disease, and stroke.  When on the DASH eating plan, aim to eat more fresh fruits and vegetables, whole grains, lean proteins, low-fat dairy, and heart-healthy fats.  With the DASH eating plan, you should limit salt (sodium) intake to 2,300 mg a day. If you have hypertension, you may need to reduce your sodium intake to 1,500 mg a day.  Work with your health care provider or dietitian to adjust your eating plan to your individual calorie needs. This information is not intended to replace advice given to you by your health care provider. Make sure you discuss any questions you have with your health care provider. Document Revised: 01/02/2019 Document Reviewed: 01/02/2019 Elsevier Patient Education  2021 Elsevier Inc.  Glaucoma  Glaucoma happens when the fluid pressure in the eye is too high. If the pressure stays high for too long, the eye may get damaged. This can cause a loss of vision. There are two types of glaucoma. They are:  Open-angle glaucoma. This is the most common type. It causes pressure in the eye to go up slowly. There may be no symptoms at first. Testing for this condition can help to find the condition before damage happens. Early treatment can often stop vision loss.  Acute angle-closure glaucoma. With this type, the pressure inside the eye rises suddenly to a very high level. This causes very bad pain and  needs to be treated right away. What are the causes? In many cases, the cause is not known. Glaucoma can  sometimes result from other diseases, such as infection, cataracts, or tumors. What increases the risk?  Being older than age 35.  Having high blood pressure or diabetes.  Having a family history of glaucoma.  Having had an eye injury or eye surgery in the past.  Being farsighted.  Taking certain medicines. What are the signs or symptoms? Open-angle glaucoma often causes no symptoms early on. If it is not treated, the condition:  Will get worse and may cause a loss of side vision (peripheral vision).  Can advance to tunnel vision, which means that you are able to see straight ahead but you have a loss of side vision in all directions.  May lead to a total loss of vision. Symptoms of acute angle-closure glaucoma develop suddenly and may include:  Cloudy vision.  Very bad pain in your affected eye.  A very bad headache in the area around your eye.  Feeling like you may vomit.  Vomiting. How is this treated?  Eye drops.  Laser treatment.  Surgery. Follow these instructions at home: Medicines  Take over-the-counter and prescription medicines only as told by your doctor.  If you were given eye drops, use them exactly as told. You will likely need to use this medicine for the rest of your life. General instructions  Exercise often. Talk with your doctor about which types of exercise are safe for you. Do not do exercises where you lean your head on the floor while you lift part of your body off the floor, such as a headstand.  Keep all follow-up visits. Contact a doctor if:  Your symptoms get worse.  You have new symptoms. Get help right away if:  You have very bad pain in your eye.  You have vision problems.  You have a bad headache in the area around your eye.  You feel like you may vomit (nauseous).  You vomit.  You start to have the same problems with your other eye. Summary  Glaucoma happens when the fluid pressure in the eye is too high. If this  is not treated, it can cause a loss of vision.  There may be no symptoms at first. Testing can help to find the condition before damage happens.  Early treatment can often stop vision loss. This information is not intended to replace advice given to you by your health care provider. Make sure you discuss any questions you have with your health care provider. Document Revised: 07/02/2019 Document Reviewed: 07/02/2019 Elsevier Patient Education  2021 [employer].
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
## 2020-07-27 ENCOUNTER — Encounter: Payer: BC Managed Care – PPO | Admitting: Podiatry

## 2020-07-29 ENCOUNTER — Ambulatory Visit: Payer: BC Managed Care – PPO | Admitting: Internal Medicine

## 2020-08-01 ENCOUNTER — Telehealth: Payer: Self-pay | Admitting: Internal Medicine

## 2020-08-01 NOTE — Telephone Encounter (Signed)
Pt is calling in stating that she would like to have a prescription for a women's vitamin and do not care to take OTC at this present due to her being very tired on a daily bases.  Pharm:  [facility] on [address]  Pt would like to have a call back.
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
## 2020-08-03 NOTE — Telephone Encounter (Signed)
Left message on machine for patient returning her call.  Patient will need to schedule an office visit.

## 2020-08-05 NOTE — Telephone Encounter (Signed)
2nd attempt Left message on machine for patient returning her call.

## 2020-08-10 ENCOUNTER — Encounter: Payer: BC Managed Care – PPO | Admitting: Podiatry

## 2020-08-10 NOTE — Telephone Encounter (Signed)
Spoke with patient and an office visit scheduled.  

## 2020-08-18 ENCOUNTER — Other Ambulatory Visit: Payer: Self-pay

## 2020-08-19 ENCOUNTER — Telehealth: Payer: Self-pay | Admitting: Urology

## 2020-08-19 ENCOUNTER — Ambulatory Visit: Payer: Self-pay | Admitting: Family Medicine

## 2020-08-19 NOTE — Telephone Encounter (Signed)
DOS - 09/12/20   [place] BUNIONECTOMY LEFT --- 73668 DOUBLE OSTEOTOMY LEFT --- 15947   [employer]BS EFFECTIVE DATE - 12/27/19     PLAN DEDUCTIBLE - $200.00 OUT OF POCKET - $500.00 COINSURANCE - 20% COPAY - $0.00   SPOKE WITH NORMAN WITH [employer] AND HE STATED THAT CPT CODES 07615 AND  28299 NO PRIOR AUTH IS REQUIRED.  REF # [id number]

## 2020-09-02 ENCOUNTER — Ambulatory Visit: Payer: BC Managed Care – PPO | Admitting: Internal Medicine

## 2020-09-09 ENCOUNTER — Telehealth: Payer: Self-pay | Admitting: Urology

## 2020-09-09 NOTE — Telephone Encounter (Signed)
GOT A CALL FROM CYNTHIA WITH [facility] AND SHE STATED THAT PT NEEDS TO CXL SX DUE TO BLOOD PRESSURE, PT WILL CALL BACK TO RESCHEDULE. I HAVE NOTIFIED DR. PATEL OF THE CHANGE.

## 2020-09-21 ENCOUNTER — Encounter: Payer: BC Managed Care – PPO | Admitting: Podiatry

## 2020-10-05 ENCOUNTER — Encounter: Payer: BC Managed Care – PPO | Admitting: Podiatry

## 2020-10-06 ENCOUNTER — Encounter: Payer: Self-pay | Admitting: Internal Medicine

## 2020-10-06 ENCOUNTER — Ambulatory Visit (INDEPENDENT_AMBULATORY_CARE_PROVIDER_SITE_OTHER): Payer: BC Managed Care – PPO | Admitting: Internal Medicine

## 2020-10-06 ENCOUNTER — Other Ambulatory Visit: Payer: Self-pay

## 2020-10-06 VITALS — BP 140/90 | HR 106 | Temp 98.1°F | Wt 172.2 lb

## 2020-10-06 DIAGNOSIS — I10 Essential (primary) hypertension: Secondary | ICD-10-CM | POA: Diagnosis not present

## 2020-10-06 DIAGNOSIS — M79672 Pain in left foot: Secondary | ICD-10-CM | POA: Diagnosis not present

## 2020-10-06 DIAGNOSIS — Y990 Civilian activity done for income or pay: Secondary | ICD-10-CM
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
## 2020-10-12 ENCOUNTER — Other Ambulatory Visit: Payer: Self-pay | Admitting: Family Medicine

## 2020-10-12 ENCOUNTER — Ambulatory Visit: Payer: Self-pay

## 2020-10-12 ENCOUNTER — Other Ambulatory Visit: Payer: Self-pay

## 2020-10-12 DIAGNOSIS — M79672 Pain in left foot: Secondary | ICD-10-CM

## 2020-10-26 ENCOUNTER — Telehealth: Payer: Self-pay | Admitting: Internal Medicine

## 2020-10-26 NOTE — Telephone Encounter (Signed)
Left detailed message on machine for patient with Dr Hernandez's recommendations.   

## 2020-10-26 NOTE — Telephone Encounter (Signed)
PT called wanting to speak to Dr.Hernandez nurse as when she did a drug test for her job they found opiates on it and she wants to make sure none of her meds are showing up as a opiate. Please advise as she states this can cost her her job.

## 2020-10-26 NOTE — Telephone Encounter (Signed)
Patient returned call and was given message patient stated she is not receiving medication from another provider.

## 2020-10-31 ENCOUNTER — Telehealth: Payer: Self-pay | Admitting: Internal Medicine

## 2020-10-31 NOTE — Telephone Encounter (Signed)
Patient called because she is having drug screens from her job come back with her having opioids and other drugs in her system. She would like to speak with Dr.Hernandez about why it may be coming back as that. Patient is scheduled for 9/21 @8 :30

## 2020-11-01 ENCOUNTER — Other Ambulatory Visit: Payer: Self-pay

## 2020-11-01 NOTE — Telephone Encounter (Signed)
Will discuss at office visit 11/02/20.

## 2020-11-02 ENCOUNTER — Ambulatory Visit: Payer: BC Managed Care – PPO | Admitting: Internal Medicine

## 2020-11-02 ENCOUNTER — Encounter: Payer: Self-pay | Admitting: Internal Medicine

## 2020-11-02 VITALS — BP 140/90 | HR 95 | Temp 98.0°F | Wt 182.1 lb

## 2020-11-02 DIAGNOSIS — R825 Elevated urine levels of drugs, medicaments and biological substances: Secondary | ICD-10-CM
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
## 2020-11-30 ENCOUNTER — Ambulatory Visit: Payer: BC Managed Care – PPO | Admitting: Podiatry

## 2020-12-20 ENCOUNTER — Telehealth (INDEPENDENT_AMBULATORY_CARE_PROVIDER_SITE_OTHER): Payer: BC Managed Care – PPO | Admitting: Family Medicine

## 2020-12-20 ENCOUNTER — Encounter: Payer: Self-pay | Admitting: Family Medicine

## 2020-12-20 DIAGNOSIS — R059 Cough, unspecified: Secondary | ICD-10-CM

## 2020-12-20 DIAGNOSIS — R0981 Nasal congestion: Secondary | ICD-10-CM | POA: Diagnosis not present

## 2020-12-20 DIAGNOSIS — H9201 Otalgia, right ear: Secondary | ICD-10-CM | POA: Diagnosis not present

## 2020-12-20 MED ORDER — BENZONATATE 100 MG PO CAPS: ORAL_CAPSULE | ORAL | 0 refills | Status: AC

## 2020-12-20 MED ORDER — AMOXICILLIN 500 MG PO CAPS: 500.0000 mg | ORAL_CAPSULE | Freq: Three times a day (TID) | ORAL | 0 refills | Status: AC
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
## 2020-12-20 NOTE — Patient Instructions (Addendum)
---------------------------------------------------------------------------------------------------------------------------    WORK SLIP:  Patient Shameeka Silliman,  January 01, 1966, was seen for a medical visit today, 12/20/20 . Please excuse from work for a COVID/flu like illness. If Covid19 testing is positive advise 10 days minimum from the onset of symptoms (12/19/20) PLUS 1 day of no fever and improved symptoms. Will defer to employer for a sooner return to work if patient has 2 negative covid tests 48 hours apart and is feeling better, or if symptoms have resolved, it is greater than 5 days since the positive test and the patient can wear a high-quality, tight fitting mask such as N95 or KN95 at all times for an additional 5 days. Would also suggest COVID19 antigen testing is negative prior to early return from a Covid illness.  Sincerely: E-signature: Dr. Kriste Basque, DO [facility] Ph: [phone]   ------------------------------------------------------------------------------------------------------------------------------   HOME CARE TIPS:  -COVID19 testing information: GoldAgenda.is  Most pharmacies also offer testing and home test kits. If the Covid19 test is positive and you desire antiviral treatment, please contact a [facility] pharmacy or schedule a follow up virtual visit through your primary care office or through the [employer].  Other test to treat options: http://www.vasquez-vaughn.biz/?click_source=alert  -I sent the medication(s) we discussed to your pharmacy: Meds ordered this encounter  Medications   amoxicillin (AMOXIL) 500 MG capsule    Sig: Take 1 capsule (500 mg total) by mouth 3 (three) times daily for 10 days.    Dispense:  30 capsule    Refill:  0   benzonatate (TESSALON PERLES) 100 MG capsule    Sig: 1-2 capsules twice daily as needed for cough    Dispense:  30 capsule    Refill:  0     -can use tylenol or aleve (do not use with ibuprofen) if needed for fevers, aches and pains per instructions  -can use nasal saline a few times per day if you have nasal congestion; sometimes  a short course of Afrin nasal spray for 3 days can help with symptoms as well  -stay hydrated, drink plenty of fluids and eat small healthy meals - avoid dairy  -can take 1000 IU ( ) Vit D3 and 100-500 mg of Vit C daily per instructions  -If the Covid test is positive, check out the [facility] website for more information on home care, transmission and treatment for COVID19  -follow up with your doctor in 2-3 days unless improving and feeling better  -stay home while sick, except to seek medical care. If you have COVID19, ideally it would be best to stay home for a full 10 days since the onset of symptoms PLUS one day of no fever and feeling better. Wear a good mask that fits snugly (such as N95 or KN95) if around others to reduce the risk of transmission.  It was nice to meet you today, and I really hope you are feeling better soon. I help  out with telemedicine visits on Tuesdays and Thursdays and am available for visits on those days. If you have any concerns or questions following this visit please schedule a follow up visit with your Primary Care doctor or seek care at a local urgent care clinic to avoid delays in care.    Seek in person care or schedule a follow up video visit promptly if your symptoms worsen, new concerns arise or you are not improving with treatment. Call 911 and/or seek emergency care if your symptoms are severe or  life threatening.

## 2020-12-23 ENCOUNTER — Telehealth: Payer: Self-pay | Admitting: *Deleted

## 2020-12-23 NOTE — Telephone Encounter (Signed)
Patient was seen virtually 12/20/20.  Patient is requesting a letter to return to work.  Okay to write the letter?   Patient is aware that Dr Selena Batten is not in the office until Tuesday.

## 2020-12-27 NOTE — Telephone Encounter (Signed)
Left a message for the patient to return my call.  

## 2021-01-11 ENCOUNTER — Emergency Department (HOSPITAL_COMMUNITY)
Admission: EM | Admit: 2021-01-11 | Discharge: 2021-01-12 | Disposition: A | Discharge status: Home / Self Care | Payer: BC Managed Care – PPO | Attending: Emergency Medicine | Admitting: Emergency Medicine

## 2021-01-11 ENCOUNTER — Encounter (HOSPITAL_COMMUNITY): Payer: Self-pay | Admitting: Emergency Medicine

## 2021-01-11 ENCOUNTER — Other Ambulatory Visit: Payer: Self-pay

## 2021-01-11 ENCOUNTER — Telehealth: Payer: Self-pay

## 2021-01-11 DIAGNOSIS — I110 Hypertensive heart disease with heart failure: Secondary | ICD-10-CM | POA: Insufficient documentation

## 2021-01-11 DIAGNOSIS — J45909 Unspecified asthma, uncomplicated: Secondary | ICD-10-CM | POA: Insufficient documentation

## 2021-01-11 DIAGNOSIS — Z7982 Long term (current) use of aspirin: Secondary | ICD-10-CM | POA: Insufficient documentation

## 2021-01-11 DIAGNOSIS — Z7951 Long term (current) use of inhaled steroids: Secondary | ICD-10-CM | POA: Insufficient documentation

## 2021-01-11 DIAGNOSIS — Z79899 Other long term (current) drug therapy: Secondary | ICD-10-CM | POA: Insufficient documentation

## 2021-01-11 DIAGNOSIS — T22232A Burn of second degree of left upper arm, initial encounter: Secondary | ICD-10-CM | POA: Insufficient documentation

## 2021-01-11 DIAGNOSIS — Z87891 Personal history of nicotine dependence: Secondary | ICD-10-CM | POA: Insufficient documentation

## 2021-01-11 DIAGNOSIS — X150XXA Contact with hot stove (kitchen), initial encounter: Secondary | ICD-10-CM | POA: Insufficient documentation

## 2021-01-11 DIAGNOSIS — T2220XA Burn of second degree of shoulder and upper limb, except wrist and hand, unspecified site, initial encounter: Secondary | ICD-10-CM

## 2021-01-11 DIAGNOSIS — I509 Heart failure, unspecified: Secondary | ICD-10-CM | POA: Insufficient documentation

## 2021-01-11 DIAGNOSIS — Z20822 Contact with and (suspected) exposure to covid-19: Secondary | ICD-10-CM | POA: Insufficient documentation

## 2021-01-11 DIAGNOSIS — T300 Burn of unspecified body region, unspecified degree: Secondary | ICD-10-CM

## 2021-01-11 DIAGNOSIS — I2510 Atherosclerotic heart disease of native coronary artery without angina pectoris: Secondary | ICD-10-CM | POA: Insufficient documentation

## 2021-01-11 DIAGNOSIS — R5383 Other fatigue: Secondary | ICD-10-CM | POA: Insufficient documentation

## 2021-01-11 DIAGNOSIS — R6883 Chills (without fever): Secondary | ICD-10-CM | POA: Insufficient documentation

## 2021-01-11 DIAGNOSIS — R0981 Nasal congestion: Secondary | ICD-10-CM | POA: Insufficient documentation

## 2021-01-11 DIAGNOSIS — T22231A Burn of second degree of right upper arm, initial encounter: Secondary | ICD-10-CM | POA: Insufficient documentation

## 2021-01-11 LAB — BASIC METABOLIC PANEL
Anion gap: 8 (ref 5–15)
BUN: 11 mg/dL (ref 6–20)
CO2: 30 mmol/L (ref 22–32)
Calcium: 9.8 mg/dL (ref 8.9–10.3)
Chloride: 102 mmol/L (ref 98–111)
Creatinine, Ser: 0.75 mg/dL (ref 0.44–1.00)
GFR, Estimated: >60 mL/min (ref >60)
Glucose, Bld: 103 mg/dL — ABNORMAL HIGH (ref 70–99)
Potassium: 3.9 mmol/L (ref 3.5–5.1)
Sodium: 140 mmol/L (ref 135–145)

## 2021-01-11 LAB — CBC WITH DIFFERENTIAL/PLATELET
Abs Immature Granulocytes: 0.01 10*3/uL (ref 0.00–0.07)
Basophils Absolute: 0 10*3/uL (ref 0.0–0.1)
Basophils Relative: 1 %
Eosinophils Absolute: 0.1 10*3/uL (ref 0.0–0.5)
Eosinophils Relative: 1 %
HCT: 43.1 % (ref 36.0–46.0)
Hemoglobin: 13.7 g/dL (ref 12.0–15.0)
Immature Granulocytes: 0 %
Lymphocytes Relative: 45 %
Lymphs Abs: 2.7 10*3/uL (ref 0.7–4.0)
MCH: 30.1 pg (ref 26.0–34.0)
MCHC: 31.8 g/dL (ref 30.0–36.0)
MCV: 94.7 fL (ref 80.0–100.0)
Monocytes Absolute: 0.4 10*3/uL (ref 0.1–1.0)
Monocytes Relative: 8 %
Neutro Abs: 2.6 10*3/uL (ref 1.7–7.7)
Neutrophils Relative %: 45 %
Platelets: 309 10*3/uL (ref 150–400)
RBC: 4.55 MIL/uL (ref 3.87–5.11)
RDW: 13.1 % (ref 11.5–15.5)
WBC: 5.8 10*3/uL (ref 4.0–10.5)
nRBC: 0 % (ref 0.0–0.2)

## 2021-01-11 MED ORDER — TETANUS-DIPHTH-ACELL PERTUSSIS 5-2.5-18.5 LF-MCG/0.5 IM SUSY: 0.5000 mL | PREFILLED_SYRINGE | Freq: Once | INTRAMUSCULAR | Status: DC
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
## 2021-01-12 LAB — RESP PANEL BY RT-PCR (FLU A&B, COVID) ARPGX2
Influenza A by PCR: NEGATIVE
Influenza B by PCR: NEGATIVE
SARS Coronavirus 2 by RT PCR: NEGATIVE

## 2021-01-12 MED ORDER — CEPHALEXIN 500 MG PO CAPS: 500.0000 mg | ORAL_CAPSULE | Freq: Two times a day (BID) | ORAL | 0 refills | Status: AC

## 2021-01-12 MED ORDER — SILVER SULFADIAZINE 1 % EX CREA: 1.0000 "application " | TOPICAL_CREAM | Freq: Every day | CUTANEOUS | 0 refills | Status: AC
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
## 2021-01-12 NOTE — Discharge Instructions (Signed)
Take the medication as prescribed  Follow up with Primary car provider

## 2021-01-25 ENCOUNTER — Encounter: Payer: BC Managed Care – PPO | Admitting: Internal Medicine

## 2021-03-01 ENCOUNTER — Encounter: Payer: Medicaid Other | Admitting: Internal Medicine

## 2021-11-09 ENCOUNTER — Ambulatory Visit: Payer: Self-pay | Admitting: Podiatry

## 2022-01-03 ENCOUNTER — Encounter: Payer: Medicaid Other | Admitting: Internal Medicine

## 2022-07-02 DIAGNOSIS — I110 Hypertensive heart disease with heart failure: Secondary | ICD-10-CM | POA: Diagnosis not present

## 2022-07-02 DIAGNOSIS — R200 Anesthesia of skin: Secondary | ICD-10-CM | POA: Diagnosis not present

## 2022-07-02 DIAGNOSIS — M545 Low back pain, unspecified: Secondary | ICD-10-CM | POA: Diagnosis not present

## 2022-07-02 DIAGNOSIS — I509 Heart failure, unspecified: Secondary | ICD-10-CM | POA: Diagnosis not present

## 2022-07-02 DIAGNOSIS — F17210 Nicotine dependence, cigarettes, uncomplicated: Secondary | ICD-10-CM | POA: Diagnosis not present

## 2022-07-02 DIAGNOSIS — R9431 Abnormal electrocardiogram [ECG] [EKG]: Secondary | ICD-10-CM | POA: Diagnosis not present

## 2022-07-02 DIAGNOSIS — M79601 Pain in right arm: Secondary | ICD-10-CM | POA: Diagnosis not present

## 2022-09-18 DIAGNOSIS — Z113 Encounter for screening for infections with a predominantly sexual mode of transmission: Secondary | ICD-10-CM | POA: Diagnosis not present

## 2022-09-18 DIAGNOSIS — Z202 Contact with and (suspected) exposure to infections with a predominantly sexual mode of transmission: Secondary | ICD-10-CM | POA: Diagnosis not present

## 2022-10-11 DIAGNOSIS — A539 Syphilis, unspecified: Secondary | ICD-10-CM | POA: Diagnosis not present
